# Patient Record
Sex: Female | Born: 1975 | Race: Black or African American | Hispanic: No | Marital: Married | Smoking: Current every day smoker
Health system: Southern US, Community
[De-identification: ages and names within clinical notes are randomized; demographics above are authoritative.]

---

## 1999-07-11 ENCOUNTER — Emergency Department (HOSPITAL_COMMUNITY): Admission: EM | Admit: 1999-07-11 | Discharge: 1999-07-11 | Payer: Self-pay | Admitting: Emergency Medicine

## 2001-12-01 ENCOUNTER — Emergency Department (HOSPITAL_COMMUNITY): Admission: EM | Admit: 2001-12-01 | Discharge: 2001-12-01 | Payer: Self-pay | Admitting: Emergency Medicine

## 2001-12-05 ENCOUNTER — Encounter: Payer: Self-pay | Admitting: Emergency Medicine

## 2001-12-05 ENCOUNTER — Emergency Department (HOSPITAL_COMMUNITY): Admission: EM | Admit: 2001-12-05 | Discharge: 2001-12-05 | Payer: Self-pay | Admitting: *Deleted

## 2002-04-04 ENCOUNTER — Emergency Department (HOSPITAL_COMMUNITY): Admission: EM | Admit: 2002-04-04 | Discharge: 2002-04-04 | Payer: Self-pay | Admitting: *Deleted

## 2002-09-07 ENCOUNTER — Emergency Department (HOSPITAL_COMMUNITY): Admission: EM | Admit: 2002-09-07 | Discharge: 2002-09-07 | Payer: Self-pay | Admitting: Emergency Medicine

## 2010-07-12 ENCOUNTER — Emergency Department: Payer: Self-pay | Admitting: Emergency Medicine

## 2014-11-07 ENCOUNTER — Emergency Department: Payer: Self-pay | Admitting: Student

## 2015-05-03 ENCOUNTER — Emergency Department: Payer: BLUE CROSS/BLUE SHIELD

## 2015-05-03 ENCOUNTER — Encounter: Payer: Self-pay | Admitting: *Deleted

## 2015-05-03 ENCOUNTER — Emergency Department
Admission: EM | Admit: 2015-05-03 | Discharge: 2015-05-04 | Disposition: A | Discharge status: Home / Self Care | Payer: BLUE CROSS/BLUE SHIELD | Attending: Emergency Medicine | Admitting: Emergency Medicine

## 2015-05-03 DIAGNOSIS — Z720 Tobacco use: Secondary | ICD-10-CM | POA: Diagnosis not present

## 2015-05-03 DIAGNOSIS — R51 Headache: Secondary | ICD-10-CM | POA: Insufficient documentation

## 2015-05-03 DIAGNOSIS — R519 Headache, unspecified: Secondary | ICD-10-CM

## 2015-05-03 LAB — CBC
HCT: 33.8 % — ABNORMAL LOW (ref 35.0–47.0)
HEMOGLOBIN: 11.1 g/dL — AB (ref 12.0–16.0)
MCH: 27.7 pg (ref 26.0–34.0)
MCHC: 32.8 g/dL (ref 32.0–36.0)
MCV: 84.7 fL (ref 80.0–100.0)
Platelets: 211 10*3/uL (ref 150–440)
RBC: 4 MIL/uL (ref 3.80–5.20)
RDW: 15.5 % — ABNORMAL HIGH (ref 11.5–14.5)
WBC: 6.6 10*3/uL (ref 3.6–11.0)

## 2015-05-03 LAB — BASIC METABOLIC PANEL
Anion gap: 6 (ref 5–15)
BUN: 7 mg/dL (ref 6–20)
CO2: 27 mmol/L (ref 22–32)
CREATININE: 0.94 mg/dL (ref 0.44–1.00)
Calcium: 8.5 mg/dL — ABNORMAL LOW (ref 8.9–10.3)
Chloride: 106 mmol/L (ref 101–111)
GFR calc non Af Amer: >60 mL/min (ref >60)
Glucose, Bld: 86 mg/dL (ref 65–99)
Potassium: 3.9 mmol/L (ref 3.5–5.1)
Sodium: 139 mmol/L (ref 135–145)

## 2015-05-03 LAB — HCG, QUANTITATIVE, PREGNANCY: hCG, Beta Chain, Quant, S: <1 m[IU]/mL (ref <5)

## 2015-05-03 MED ORDER — SODIUM CHLORIDE 0.9 % IV BOLUS (SEPSIS): 1000.0000 mL | Freq: Once | INTRAVENOUS | Status: DC

## 2015-05-03 MED ORDER — METOCLOPRAMIDE HCL 5 MG/ML IJ SOLN
10.0000 mg | Freq: Once | INTRAMUSCULAR | Status: AC
  Administered 2015-05-03: 10 mg via INTRAVENOUS

## 2015-05-03 MED ORDER — MORPHINE SULFATE 4 MG/ML IJ SOLN
INTRAMUSCULAR | Status: AC
  Administered 2015-05-03: 4 mg via INTRAVENOUS
  Filled 2015-05-03: qty 1

## 2015-05-03 MED ORDER — SODIUM CHLORIDE 0.9 % IV BOLUS (SEPSIS)
1000.0000 mL | Freq: Once | INTRAVENOUS | Status: AC
  Administered 2015-05-03: 1000 mL via INTRAVENOUS

## 2015-05-03 MED ORDER — MORPHINE SULFATE 4 MG/ML IJ SOLN
4.0000 mg | Freq: Once | INTRAMUSCULAR | Status: AC
  Administered 2015-05-03: 4 mg via INTRAVENOUS

## 2015-05-03 MED ORDER — METOCLOPRAMIDE HCL 5 MG/ML IJ SOLN
INTRAMUSCULAR | Status: AC
  Administered 2015-05-03: 10 mg via INTRAVENOUS
  Filled 2015-05-03: qty 2

## 2015-05-03 MED ORDER — IOHEXOL 350 MG/ML SOLN
80.0000 mL | Freq: Once | INTRAVENOUS | Status: AC | PRN
  Administered 2015-05-03: 80 mL via INTRAVENOUS

## 2015-05-03 NOTE — ED Provider Notes (Signed)
[facility] Emergency Department Provider Note  ____________________________________________  Time seen: Approximately 8:41 PM  I have reviewed the triage vital signs and the nursing notes.   HISTORY  Chief Complaint Headache    HPI Nancy Hull is a 39 y.o. female with minimal past medical history. She presents today because of a headache. Last evening she noted a slow in onset and progressively worsening headache that is throbbing over the left side of her face. Mostly over the forehead. No eye pain. It was not sudden in onset. It is not the worst headache she had her life. There is no associated numbness or tingling or trouble speaking. No problems walking. She did try to take, profound home, but states that this only helped minimally. She went to work today and the headache to seem to continue, and is not improving.  She does notice that bright lights make her headache worse. She has no fever and no pain in her neck. She does report that her sister has a history of an aneurysm, but never needed surgery and this is been monitored.  Patient reports she has had previous headaches that have been similar, the last one was a few years ago but today's does seem slightly worse.     History reviewed. No pertinent past medical history.  There are no active problems to display for this patient.   Past Surgical History  Procedure Laterality Date  . Cesarean section      No current outpatient prescriptions on file.  Allergies Review of patient's allergies indicates no known allergies.  No family history on file.  Social History History  Substance Use Topics  . Smoking status: Current Every Day Smoker  . Smokeless tobacco: Not on file  . Alcohol Use: Yes    Review of Systems Constitutional: No fever/chills Eyes: No visual changes. ENT: No sore throat. Cardiovascular: Denies chest pain. Respiratory: Denies shortness of  breath. Gastrointestinal: No abdominal pain.  No nausea, no vomiting.  No diarrhea.  No constipation. Genitourinary: Negative for dysuria. Musculoskeletal: Negative for back pain. Skin: Negative for rash. Neurological: Negative for focal weakness or numbness.  10-point ROS otherwise negative.  ____________________________________________   PHYSICAL EXAM:  VITAL SIGNS: ED Triage Vitals  Enc Vitals Group     BP 05/03/15 1736 134/87 mmHg     Pulse Rate 05/03/15 1736 66     Resp 05/03/15 1736 20     Temp 05/03/15 1736 98.4 F (36.9 C)     Temp src --      SpO2 05/03/15 1736 98 %     Weight 05/03/15 1736 157 lb (71.215 kg)     Height 05/03/15 1736 5\' 2"  (1.575 m)     Head Cir --      Peak Flow --      Pain Score 05/03/15 1737 10     Pain Loc --      Pain Edu? --      Excl. in GC? --     Constitutional: Alert and oriented. Well appearing and in no acute distress. Eyes: Conjunctivae are normal. PERRL. EOMI. there is mild photophobia. No tenderness over the temporal arteries, both with normal pulsations. Head: Atraumatic. Nose: No congestion/rhinnorhea. Mouth/Throat: Mucous membranes are moist.  Oropharynx non-erythematous. Neck: No stridor.  No meningismus. Negative jolt accentuation. Cardiovascular: Normal rate, regular rhythm. Grossly normal heart sounds.  Good peripheral circulation. Respiratory: Normal respiratory effort.  No retractions. Lungs CTAB. Gastrointestinal: Soft and nontender. No distention. No abdominal  bruits. No CVA tenderness. Musculoskeletal: No lower extremity tenderness nor edema.  No joint effusions. Neurologic:  Normal speech and language. No gross focal neurologic deficits are appreciated. Speech is normal. She is able to walk with normal gait. No pronator drift. Cranial nerve exam is normal. 5 out of 5 strength and normal sensation in all extremities. Skin:  Skin is warm, dry and intact. No rash noted. Psychiatric: Mood and affect are normal. Speech  and behavior are normal.  ____________________________________________   LABS (all labs ordered are listed, but only abnormal results are displayed)  Labs Reviewed  CBC - Abnormal; Notable for the following:    Hemoglobin 11.1 (*)    HCT 33.8 (*)    RDW 15.5 (*)    All other components within normal limits  BASIC METABOLIC PANEL - Abnormal; Notable for the following:    Calcium 8.5 (*)    All other components within normal limits  HCG, QUANTITATIVE, PREGNANCY   ____________________________________________  EKG   ____________________________________________  RADIOLOGY  CT Angio Head W/Cm &/Or Wo Cm (Final result) Result time: 05/03/15 22:49:56   Final result by Rad Results In Interface (05/03/15 22:49:56)   Narrative:   CLINICAL DATA: Initial evaluation for acute left-sided headache.  EXAM: CT ANGIOGRAPHY HEAD  TECHNIQUE: Multidetector CT imaging of the head was performed using the standard protocol during bolus administration of intravenous contrast. Multiplanar CT image reconstructions and MIPs were obtained to evaluate the vascular anatomy.  CONTRAST: 80mL OMNIPAQUE IOHEXOL 350 MG/ML SOLN  COMPARISON: Prior CT from 07/13/2010.  FINDINGS: CT HEAD  There is no acute intracranial hemorrhage or infarct. No mass lesion or midline shift. Gray-white matter differentiation is well maintained. Ventricles are normal in size without evidence of hydrocephalus. CSF containing spaces are within normal limits. No extra-axial fluid collection.  The calvarium is intact.  Orbital soft tissues are within normal limits.  The paranasal sinuses and mastoid air cells are well pneumatized and free of fluid.  Scalp soft tissues are unremarkable.  CTA HEAD  Anterior circulation: Visualized portions of the distal cervical segments of the internal carotid arteries are widely patent. The petrous, cavernous, and supra clinoid segments are well opacified. A1 segments,  anterior communicating artery, and anterior cerebral arteries well opacified.  M1 segments are widely patent without stenosis or occlusion. MCA bifurcations within normal limits. Distal MCA branches well opacified.  Posterior circulation: Vertebral arteries are widely patent to the vertebrobasilar junction. Posterior inferior cerebral arteries are patent bilaterally. Basilar artery widely patent. Superior cerebellar arteries are well opacified bilaterally. Both the posterior cerebral arteries arise from the basilar arteries. No stenosis or occlusion within the posterior circulation.  Venous sinuses: Venous sinuses are widely patent without thrombosis or occlusion.  Anatomic variants: No anatomic variant. No aneurysm.  Delayed phase:No abnormal enhancement on delayed sequence.  IMPRESSION: 1. No acute intracranial process. 2. Normal CTA of the head.      CT head with and without contrast was performed because of the patient's family history of aneurysm and a sister. ____________________________________________   PROCEDURES  Procedure(s) performed: None  Critical Care performed: No  ____________________________________________   INITIAL IMPRESSION / ASSESSMENT AND PLAN / ED COURSE  Pertinent labs & imaging results that were available during my care of the patient were reviewed by me and considered in my medical decision making (see chart for details).  Patient presents with throbbing left-sided headache which is moderate to severe in intensity and associated with some photophobia. She has no fevers or meningismus  associated. No systemic symptoms such as fever or anything to indicate the possibility of meningitis.  Based on the patient's history of a slow in onset throbbing left-sided headache I would favor that this is likely not related to an aneurysm, but given the patient's family history of cerebral aneurysm we will obtain angiography as well as noncontrast head CT.  I will treat her pain with antiemetics and pain medicine and we will reassess.  ----------------------------------------- 10:36 PM on 05/03/2015 -----------------------------------------  Patient reports that her headache is essentially gone now. She feels much better. Her exam is very reassuring and she is fully alert with normal neurologic exam. We continue weight CT angiography. Based on the symptomatology of a relatively slow onset throbbing left-sided headache, significant improvement, no evidence of any infectious etiology and negative meningismus/jolt accentuation, I do feel that should CT CTA be normal the patient could be safely discharged home to follow up with neurology and her primary care doctor.  I did discuss good return precautions with the patient, and she and her family are quite agreeable with plan. She is not driving self home. ____________________________________________   FINAL CLINICAL IMPRESSION(S) / ED DIAGNOSES  Final diagnoses:  Acute nonintractable headache, unspecified headache type      Sharyn Creamer, MD 05/03/15 2302

## 2015-05-03 NOTE — Discharge Instructions (Signed)
General Headache Without Cause  Please follow-up with neurology and your doctor area return to the emergency room right away should you have severe headache, weakness, numbness or tingling, develop a fever, stiff neck or other concerns or symptoms arise.  A general headache is pain or discomfort felt around the head or neck area. The cause may not be found.  HOME CARE   Keep all doctor visits.  Only take medicines as told by your doctor.  Lie down in a dark, quiet room when you have a headache.  Keep a journal to find out if certain things bring on headaches. For example, write down:  What you eat and drink.  How much sleep you get.  Any change to your diet or medicines.  Relax by getting a massage or doing other relaxing activities.  Put ice or heat packs on the head and neck area as told by your doctor.  Lessen stress.  Sit up straight. Do not tighten (tense) your muscles.  Quit smoking if you smoke.  Lessen how much alcohol you drink.  Lessen how much caffeine you drink, or stop drinking caffeine.  Eat and sleep on a regular schedule.  Get 7 to 9 hours of sleep, or as told by your doctor.  Keep lights dim if bright lights bother you or make your headaches worse. GET HELP RIGHT AWAY IF:   Your headache becomes really bad.  You have a fever.  You have a stiff neck.  You have trouble seeing.  Your muscles are weak, or you lose muscle control.  You lose your balance or have trouble walking.  You feel like you will pass out (faint), or you pass out.  You have really bad symptoms that are different than your first symptoms.  You have problems with the medicines given to you by your doctor.  Your medicines do not work.  Your headache feels different than the other headaches.  You feel sick to your stomach (nauseous) or throw up (vomit). MAKE SURE YOU:   Understand these instructions.  Will watch your condition.  Will get help right away if you are  not doing well or get worse. Document Released: 07/24/2008 Document Revised: 01/07/2012 Document Reviewed: 10/05/2011 [facility] Patient Information 2015 [employer], [place]. This information is not intended to replace advice given to you by your health care provider. Make sure you discuss any questions you have with your health care provider.

## 2015-05-03 NOTE — ED Notes (Signed)
Patient denies pain and is resting comfortably.  

## 2015-05-03 NOTE — ED Notes (Signed)
Ha since yesterday, light hurts her eyes

## 2016-02-12 ENCOUNTER — Emergency Department (HOSPITAL_COMMUNITY)
Admission: EM | Admit: 2016-02-12 | Discharge: 2016-02-12 | Disposition: A | Discharge status: Home / Self Care | Payer: BLUE CROSS/BLUE SHIELD | Attending: Emergency Medicine | Admitting: Emergency Medicine

## 2016-02-12 ENCOUNTER — Emergency Department (HOSPITAL_COMMUNITY): Payer: BLUE CROSS/BLUE SHIELD

## 2016-02-12 ENCOUNTER — Encounter (HOSPITAL_COMMUNITY): Payer: Self-pay | Admitting: Emergency Medicine

## 2016-02-12 DIAGNOSIS — R51 Headache: Secondary | ICD-10-CM | POA: Insufficient documentation

## 2016-02-12 DIAGNOSIS — D649 Anemia, unspecified: Secondary | ICD-10-CM | POA: Insufficient documentation

## 2016-02-12 DIAGNOSIS — R112 Nausea with vomiting, unspecified: Secondary | ICD-10-CM | POA: Insufficient documentation

## 2016-02-12 DIAGNOSIS — F17200 Nicotine dependence, unspecified, uncomplicated: Secondary | ICD-10-CM | POA: Insufficient documentation

## 2016-02-12 DIAGNOSIS — R519 Headache, unspecified: Secondary | ICD-10-CM

## 2016-02-12 LAB — CBC WITH DIFFERENTIAL/PLATELET
BASOS ABS: 0 10*3/uL (ref 0.0–0.1)
BASOS PCT: 0 %
Eosinophils Absolute: 0.1 10*3/uL (ref 0.0–0.7)
Eosinophils Relative: 1 %
HEMATOCRIT: 36 % (ref 36.0–46.0)
HEMOGLOBIN: 11.5 g/dL — AB (ref 12.0–15.0)
Lymphocytes Relative: 39 %
Lymphs Abs: 2.5 10*3/uL (ref 0.7–4.0)
MCH: 27.4 pg (ref 26.0–34.0)
MCHC: 31.9 g/dL (ref 30.0–36.0)
MCV: 85.7 fL (ref 78.0–100.0)
MONOS PCT: 6 %
Monocytes Absolute: 0.4 10*3/uL (ref 0.1–1.0)
NEUTROS ABS: 3.4 10*3/uL (ref 1.7–7.7)
Neutrophils Relative %: 54 %
Platelets: 239 10*3/uL (ref 150–400)
RBC: 4.2 MIL/uL (ref 3.87–5.11)
RDW: 14.3 % (ref 11.5–15.5)
WBC: 6.3 10*3/uL (ref 4.0–10.5)

## 2016-02-12 LAB — BASIC METABOLIC PANEL
ANION GAP: 9 (ref 5–15)
BUN: 7 mg/dL (ref 6–20)
CHLORIDE: 107 mmol/L (ref 101–111)
CO2: 23 mmol/L (ref 22–32)
Calcium: 8.8 mg/dL — ABNORMAL LOW (ref 8.9–10.3)
Creatinine, Ser: 0.82 mg/dL (ref 0.44–1.00)
GFR calc non Af Amer: >60 mL/min (ref >60)
Glucose, Bld: 87 mg/dL (ref 65–99)
Potassium: 4.1 mmol/L (ref 3.5–5.1)
Sodium: 139 mmol/L (ref 135–145)

## 2016-02-12 MED ORDER — KETOROLAC TROMETHAMINE 30 MG/ML IJ SOLN
30.0000 mg | Freq: Once | INTRAMUSCULAR | Status: AC
  Administered 2016-02-12: 30 mg via INTRAVENOUS
  Filled 2016-02-12: qty 1

## 2016-02-12 MED ORDER — METHYLPREDNISOLONE SODIUM SUCC 125 MG IJ SOLR
125.0000 mg | Freq: Once | INTRAMUSCULAR | Status: AC
  Administered 2016-02-12: 125 mg via INTRAVENOUS
  Filled 2016-02-12: qty 2

## 2016-02-12 MED ORDER — SODIUM CHLORIDE 0.9 % IV BOLUS (SEPSIS)
1000.0000 mL | Freq: Once | INTRAVENOUS | Status: AC
  Administered 2016-02-12: 1000 mL via INTRAVENOUS

## 2016-02-12 MED ORDER — PROCHLORPERAZINE EDISYLATE 5 MG/ML IJ SOLN
10.0000 mg | Freq: Once | INTRAMUSCULAR | Status: AC
  Administered 2016-02-12: 10 mg via INTRAVENOUS
  Filled 2016-02-12: qty 2

## 2016-02-12 NOTE — ED Notes (Signed)
Pt. reports headache with photophobia , emesis and right ear ache onset this week . Denies injury , no fever or chills.

## 2016-02-12 NOTE — ED Provider Notes (Signed)
CSN: [id number]     Arrival date & time 02/12/16  0540 History   First MD Initiated Contact with Patient 02/12/16 [phone]     Chief Complaint  Patient presents with  . Headache  . Otalgia     (Consider location/radiation/quality/duration/timing/severity/associated sxs/prior Treatment) HPI 40 y.o. Female complaining of headache and ear pain began 3 days ago.  She first noted pain right cheek and thought it was her teeth. Pain then migrated to in front of right ear and now generalized headache.  2 days ago began having nausea and vomiting.  She denies fever, vision changes, cough, sore throat, nasal congestion, abdominal pain or diarrhea.  She does not usually get headaches.  Onset was gradual, now 9/10 pain.  Taking ibuprofen without relief.  She is currently menstruating.  She has one previous visit for severe headache.  She denies history of migraines.   History reviewed. No pertinent past medical history. Past Surgical History  Procedure Laterality Date  . Cesarean section     No family history on file. Social History  Substance Use Topics  . Smoking status: Current Every Day Smoker  . Smokeless tobacco: None  . Alcohol Use: Yes   OB History    No data available     Review of Systems  All other systems reviewed and are negative.     Allergies  Review of patient's allergies indicates no known allergies.  Home Medications   Prior to Admission medications   Not on File   BP 130/70 mmHg  Pulse 68  Temp(Src) 98 F (36.7 C) (Oral)  Resp 18  Ht 5\' 3"  (1.6 m)  Wt 81.647 kg  BMI 31.89 kg/m2  SpO2 100%  LMP 02/12/2016 Physical Exam  Constitutional: She is oriented to person, place, and time. She appears well-developed and well-nourished.  HENT:  Head: Normocephalic and atraumatic.  Right Ear: External ear normal.  Left Ear: External ear normal.  Nose: Nose normal.  Mouth/Throat: Oropharynx is clear and moist.  Eyes: Conjunctivae and EOM are normal. Pupils are equal,  round, and reactive to light.  Neck: Normal range of motion. Neck supple.  Cardiovascular: Normal rate, regular rhythm, normal heart sounds and intact distal pulses.   Pulmonary/Chest: Effort normal and breath sounds normal.  Abdominal: Soft. Bowel sounds are normal.  Musculoskeletal: Normal range of motion.  Neurological: She is alert and oriented to person, place, and time. She has normal reflexes.  Skin: Skin is warm and dry.  Psychiatric: She has a normal mood and affect. Her behavior is normal. Judgment and thought content normal.  Nursing note and vitals reviewed.   ED Course  Procedures (including critical care time) Labs Review Labs Reviewed  CBC WITH DIFFERENTIAL/PLATELET  BASIC METABOLIC PANEL    Imaging Review Ct Head Wo Contrast  02/12/2016  CLINICAL DATA:  40 year old female with headache for the past 2 days and photosensitivity. EXAM: CT HEAD WITHOUT CONTRAST TECHNIQUE: Contiguous axial images were obtained from the base of the skull through the vertex without intravenous contrast. COMPARISON:  Head CT 05/03/2015. FINDINGS: No acute intracranial abnormalities. Specifically, no evidence of acute intracranial hemorrhage, no definite findings of acute/subacute cerebral ischemia, no mass, mass effect, hydrocephalus or abnormal intra or extra-axial fluid collections. Visualized paranasal sinuses and mastoids are well pneumatized. No acute displaced skull fractures are identified. IMPRESSION: *No acute intracranial abnormalities. *The appearance of the brain is normal. Electronically Signed   By: Trudie Reedaniel  Entrikin M.D.   On: 02/12/2016 08:09   I have  personally reviewed and evaluated these images and lab results as part of my medical decision-making.   EKG Interpretation None      MDM   Final diagnoses:  Acute nonintractable headache, unspecified headache type  Anemia, unspecified anemia type    40 year old female came in with right-sided headache. She has had some facial  pain and pain anterior to her ear with this. Subsequent to this a head CT was obtained. There is no evidence of dental abscess, sinus infection, mastoiditis, or intracranial pathology. She is better here after Compazine, Solu-Medrol, and Toradol. She is advised regarding return precautions and need for close follow-up. Her hemoglobin was slightly low at 11 on her lab work. She states that she does have heavy periods. I have advised her to start iron and to obtain follow-up and she voices understanding.    Margarita Grizzle, MD 02/14/16 [phone]

## 2016-02-12 NOTE — Discharge Instructions (Signed)
Anemia, Nonspecific Anemia is a condition in which the concentration of red blood cells or hemoglobin in the blood is below normal. Hemoglobin is a substance in red blood cells that carries oxygen to the tissues of the body. Anemia results in not enough oxygen reaching these tissues.  CAUSES  Common causes of anemia include:   Excessive bleeding. Bleeding may be internal or external. This includes excessive bleeding from periods (in women) or from the intestine.   Poor nutrition.   Chronic kidney, thyroid, and liver disease.  Bone marrow disorders that decrease red blood cell production.  Cancer and treatments for cancer.  HIV, AIDS, and their treatments.  Spleen problems that increase red blood cell destruction.  Blood disorders.  Excess destruction of red blood cells due to infection, medicines, and autoimmune disorders. SIGNS AND SYMPTOMS   Minor weakness.   Dizziness.   Headache.  Palpitations.   Shortness of breath, especially with exercise.   Paleness.  Cold sensitivity.  Indigestion.  Nausea.  Difficulty sleeping.  Difficulty concentrating. Symptoms may occur suddenly or they may develop slowly.  DIAGNOSIS  Additional blood tests are often needed. These help your health care provider determine the best treatment. Your health care provider will check your stool for blood and look for other causes of blood loss.  TREATMENT  Treatment varies depending on the cause of the anemia. Treatment can include:   Supplements of iron, vitamin B12, or folic acid.   Hormone medicines.   A blood transfusion. This may be needed if blood loss is severe.   Hospitalization. This may be needed if there is significant continual blood loss.   Dietary changes.  Spleen removal. HOME CARE INSTRUCTIONS Keep all follow-up appointments. It often takes many weeks to correct anemia, and having your health care provider check on your condition and your response to  treatment is very important. SEEK IMMEDIATE MEDICAL CARE IF:   You develop extreme weakness, shortness of breath, or chest pain.   You become dizzy or have trouble concentrating.  You develop heavy vaginal bleeding.   You develop a rash.   You have bloody or black, tarry stools.   You faint.   You vomit up blood.   You vomit repeatedly.   You have abdominal pain.  You have a fever or persistent symptoms for more than 2-3 days.   You have a fever and your symptoms suddenly get worse.   You are dehydrated.  MAKE SURE YOU:  Understand these instructions.  Will watch your condition.  Will get help right away if you are not doing well or get worse.   This information is not intended to replace advice given to you by your health care provider. Make sure you discuss any questions you have with your health care provider.   Document Released: 11/22/2004 Document Revised: 06/17/2013 Document Reviewed: 04/10/2013 Elsevier Interactive Patient Education 2016 [employer]. Migraine Headache A migraine headache is very bad, throbbing pain on one or both sides of your head. Talk to your doctor about what things may bring on (trigger) your migraine headaches. HOME CARE  Only take medicines as told by your doctor.  Lie down in a dark, quiet room when you have a migraine.  Keep a journal to find out if certain things bring on migraine headaches. For example, write down:  What you eat and drink.  How much sleep you get.  Any change to your diet or medicines.  Lessen how much alcohol you drink.  Quit smoking  if you smoke.  Get enough sleep.  Lessen any stress in your life.  Keep lights dim if bright lights bother you or make your migraines worse. GET HELP RIGHT AWAY IF:   Your migraine becomes really bad.  You have a fever.  You have a stiff neck.  You have trouble seeing.  Your muscles are weak, or you lose muscle control.  You lose your balance or  have trouble walking.  You feel like you will pass out (faint), or you pass out.  You have really bad symptoms that are different than your first symptoms. MAKE SURE YOU:   Understand these instructions.  Will watch your condition.  Will get help right away if you are not doing well or get worse.   This information is not intended to replace advice given to you by your health care provider. Make sure you discuss any questions you have with your health care provider.   Document Released: 07/24/2008 Document Revised: 01/07/2012 Document Reviewed: 06/22/2013 Elsevier Interactive Patient Education [employer]

## 2016-02-12 NOTE — ED Notes (Signed)
Patient transported to CT 

## 2016-07-05 ENCOUNTER — Emergency Department (HOSPITAL_COMMUNITY)
Admission: EM | Admit: 2016-07-05 | Discharge: 2016-07-05 | Disposition: A | Discharge status: Home / Self Care | Payer: BLUE CROSS/BLUE SHIELD | Attending: Emergency Medicine | Admitting: Emergency Medicine

## 2016-07-05 ENCOUNTER — Encounter (HOSPITAL_COMMUNITY): Payer: Self-pay | Admitting: Emergency Medicine

## 2016-07-05 DIAGNOSIS — F17200 Nicotine dependence, unspecified, uncomplicated: Secondary | ICD-10-CM | POA: Diagnosis not present

## 2016-07-05 DIAGNOSIS — H9201 Otalgia, right ear: Secondary | ICD-10-CM | POA: Insufficient documentation

## 2016-07-05 DIAGNOSIS — K0889 Other specified disorders of teeth and supporting structures: Secondary | ICD-10-CM | POA: Diagnosis present

## 2016-07-05 MED ORDER — NAPROXEN 500 MG PO TABS: 500.0000 mg | ORAL_TABLET | Freq: Two times a day (BID) | ORAL | 0 refills | Status: DC

## 2016-07-05 MED ORDER — PENICILLIN V POTASSIUM 500 MG PO TABS: 500.0000 mg | ORAL_TABLET | Freq: Four times a day (QID) | ORAL | 0 refills | Status: AC

## 2016-07-05 MED ORDER — LIDOCAINE VISCOUS 2 % MT SOLN: 15.0000 mL | OROMUCOSAL | 0 refills | Status: AC | PRN

## 2016-07-05 NOTE — ED Provider Notes (Signed)
[facility]EM[facility] DEPT Provider Note   CSN: [id number] Arrival date & time: 07/05/16  0920  By signing my name below, I, Placido SouLogan Joldersma, attest that this documentation has been prepared under the direction and in the presence of Laasya Peyton C. Quinlan Vollmer, PA-C. Electronically Signed: Placido SouLogan Joldersma, ED Scribe. 07/05/16. 10:02 AM.   History   Chief Complaint Chief Complaint  Patient presents with  . Dental Pain  . Otalgia    HPI HPI Comments: Nancy Hull is a 40 y.o. female with a h/o smoking who presents to the Emergency Department complaining of worsening, moderate, right upper dental pain x 2 days. Pt reports having broken teeth in the affected region and has associated right ear pain. Her pain worsens with jaw movement, palpation and chewing. She is unsure of any right ear drainage but does note applying ear drops to her right ear w/o significant relief. Pt just relocated to the area and does not have a local dental provider. Pt denies other associated symptoms at this time including fever/chills, N/V, or difficulty swallowing or breathing.   The history is provided by the patient. No language interpreter was used.    History reviewed. No pertinent past medical history.  There are no active problems to display for this patient.   Past Surgical History:  Procedure Laterality Date  . CESAREAN SECTION      OB History    No data available      Home Medications    Prior to Admission medications   Medication Sig Start Date End Date Taking? Authorizing Provider  lidocaine (XYLOCAINE) 2 % solution Use as directed 15 mLs in the mouth or throat as needed for mouth pain. 07/05/16   Stacee Earp C Aulden Calise, PA-C  naproxen (NAPROSYN) 500 MG tablet Take 1 tablet (500 mg total) by mouth 2 (two) times daily. 07/05/16   Aayansh Codispoti C Carlena Ruybal, PA-C  penicillin v potassium (VEETID) 500 MG tablet Take 1 tablet (500 mg total) by mouth 4 (four) times daily. 07/05/16 07/12/16  Anselm PancoastShawn C Monserat Prestigiacomo, PA-C    Family History No family  history on file.  Social History Social History  Substance Use Topics  . Smoking status: Current Every Day Smoker  . Smokeless tobacco: Not on file  . Alcohol use Yes    Allergies   Review of patient's allergies indicates no known allergies.   Review of Systems Review of Systems  Constitutional: Negative for chills and fever.  HENT: Positive for dental problem and ear pain. Negative for ear discharge and sore throat.    Physical Exam Updated Vital Signs BP 134/72 (BP Location: Left Arm)   Pulse 62   Temp 99 F (37.2 C) (Oral)   Resp 20   Ht 5\' 2"  (1.575 m)   Wt 178 lb (80.7 kg)   LMP 06/13/2016 (Exact Date)   SpO2 100%   BMI 32.56 kg/m   Physical Exam  Constitutional: She appears well-developed and well-nourished. No distress.  HENT:  Head: Normocephalic and atraumatic.  Right Ear: Hearing, tympanic membrane, external ear and ear canal normal.  Left Ear: Hearing, tympanic membrane, external ear and ear canal normal.  Tenderness to the right upper buccal surface and to the right upper rear gingival surface. No area of fluctuance to suggest an abscess. No facial swelling. Readily handles oral secretions w/o difficulty. Mouth opening to at least three finger widths.   Eyes: Conjunctivae are normal.  Neck: Normal range of motion. Neck supple.  Cardiovascular: Normal rate and regular rhythm.  Pulmonary/Chest: Effort normal.  Lymphadenopathy:    She has no cervical adenopathy.  Neurological: She is alert.  Skin: Skin is warm and dry. She is not diaphoretic.  Psychiatric: She has a normal mood and affect. Her behavior is normal.  Nursing note and vitals reviewed.  ED Treatments / Results  Labs (all labs ordered are listed, but only abnormal results are displayed) Labs Reviewed - No data to display  EKG  EKG Interpretation None       Radiology No results found.  Procedures .Nerve Block Date/Time: 07/05/2016 10:04 AM Performed by: Anselm Pancoast Authorized  by: Anselm Pancoast   Consent:    Consent obtained:  Verbal   Consent given by:  Patient   Risks discussed:  Allergic reaction, bleeding, infection, nerve damage, swelling, unsuccessful block and pain   Alternatives discussed:  No treatment and delayed treatment Indications:    Indications:  Pain relief Location:    Body area:  Head   Head nerve blocked: Superior posterior alveolar.   Laterality:  Right Skin anesthesia (see MAR for exact dosages):    Skin anesthesia method:  Topical application   Topical anesthetic:  Lidocaine gel Procedure details (see MAR for exact dosages):    Block needle gauge:  27 G   Anesthetic injected:  Bupivacaine 0.5% WITH epi Post-procedure details:    Outcome:  Pain relieved   Patient tolerance of procedure:  Tolerated well, no immediate complications     DIAGNOSTIC STUDIES: Oxygen Saturation is 100% on RA, normal by my interpretation.    COORDINATION OF CARE: 10:01 AM Discussed next steps with pt. Pt verbalized understanding and is agreeable with the plan.    Medications Ordered in ED Medications - No data to display   Initial Impression / Assessment and Plan / ED Course  I have reviewed the triage vital signs and the nursing notes.  Pertinent labs & imaging results that were available during my care of the patient were reviewed by me and considered in my medical decision making (see chart for details).  Clinical Course    Patient with dentalgia.  No abscess requiring immediate incision and drainage.  Exam not concerning for Ludwig's angina or pharyngeal abscess.  Pt given a dental block for short term pain relief and instructed to follow-up with dentist.  Discussed return precautions. Pt safe for discharge.  I personally performed the services described in this documentation, which was scribed in my presence. The recorded information has been reviewed and is accurate.   Final Clinical Impressions(s) / ED Diagnoses   Final diagnoses:    Dentalgia    New Prescriptions Discharge Medication List as of 07/05/2016 10:18 AM    START taking these medications   Details  lidocaine (XYLOCAINE) 2 % solution Use as directed 15 mLs in the mouth or throat as needed for mouth pain., Starting Thu 07/05/2016, Print    naproxen (NAPROSYN) 500 MG tablet Take 1 tablet (500 mg total) by mouth 2 (two) times daily., Starting Thu 07/05/2016, Print    penicillin v potassium (VEETID) 500 MG tablet Take 1 tablet (500 mg total) by mouth 4 (four) times daily., Starting Thu 07/05/2016, Until Thu 07/12/2016, Print         Anselm Pancoast, PA-C 07/05/16 1043    Vanetta Mulders, MD 07/07/16 [phone]

## 2016-07-05 NOTE — ED Triage Notes (Signed)
Started 2 days ago with right ear vs right dental pain.

## 2016-07-05 NOTE — Discharge Instructions (Signed)
You have been seen today for dental pain. Follow up with a dentist as soon as possible. Please take all of your antibiotics until finished!   You may develop abdominal discomfort or diarrhea from the antibiotic.  You may help offset this with probiotics which you can buy or get in yogurt. Do not eat or take the probiotics until 2 hours after your antibiotic.   Take 500 mg of naproxen every 12 hours or 800 mg of ibuprofen every 8 hours for the next 3 days. Take these medications with food to avoid upset stomach.   Use the viscous lidocaine as needed for mouth pain. Swish with the lidocaine and then spit it out. Do not swallow it.   Dental Resource Guide  [facility] [address], [address] [place], [place] [address] [phone]  [facility] [place] [address] [place], [place] [address] [phone]  [facility] [address] [place], [place] [address] [phone]. [phone]  [facility] [address] [place], [place] [address] [phone]  [facility] [address] [place], [place] [address] [phone]  [facility] [email or web address]/patientcare/[email or web address]  [employer] [address] [facility] [place], [place] [address] [phone]  Website for free, low-income, or sliding scale dental services in [place]: www.freedental.us  To find a dentist in [place] and surrounding areas: [employer].[email or web address]/for-the-public/find-[email or web address]-dentist  [facility] Test[employer].athttp://www.ncdental.org/meetings-events/[place]-missions-of-mercy  [facility] Dentist http://[email or web address]/https://dma.[email or web address]/find-[email or web address]-doctor/medicaid-dental-providers

## 2016-07-20 ENCOUNTER — Ambulatory Visit (HOSPITAL_COMMUNITY)
Admission: EM | Admit: 2016-07-20 | Discharge: 2016-07-20 | Disposition: A | Discharge status: Home / Self Care | Payer: BLUE CROSS/BLUE SHIELD | Attending: Internal Medicine | Admitting: Internal Medicine

## 2016-07-20 ENCOUNTER — Encounter (HOSPITAL_COMMUNITY): Payer: Self-pay | Admitting: Emergency Medicine

## 2016-07-20 DIAGNOSIS — K0889 Other specified disorders of teeth and supporting structures: Secondary | ICD-10-CM

## 2016-07-20 DIAGNOSIS — K047 Periapical abscess without sinus: Secondary | ICD-10-CM | POA: Diagnosis not present

## 2016-07-20 MED ORDER — ONDANSETRON HCL 4 MG PO TABS: 4.0000 mg | ORAL_TABLET | Freq: Four times a day (QID) | ORAL | 0 refills | Status: DC

## 2016-07-20 MED ORDER — OXYCODONE-ACETAMINOPHEN 10-325 MG PO TABS: 1.0000 | ORAL_TABLET | ORAL | 0 refills | Status: DC | PRN

## 2016-07-20 MED ORDER — AMOXICILLIN 500 MG PO CAPS: 500.0000 mg | ORAL_CAPSULE | Freq: Three times a day (TID) | ORAL | 0 refills | Status: DC

## 2016-07-20 NOTE — ED Provider Notes (Signed)
CSN: [id number]     Arrival date & time 07/20/16  1913 History   None    Chief Complaint  Patient presents with  . Dental Pain   (Consider location/radiation/quality/duration/timing/severity/associated sxs/prior Treatment) HPI  Patient is a 40 year old female with right ear and jaw pain that has been present since Monday with associated nausea and vomiting. She states that she has used multiple symptomatic relief medications at home and has been unsuccessful she states that she does have a dental appointment for 29th. Patient states her pain score is about 6 at this time. History reviewed. No pertinent past medical history. Past Surgical History:  Procedure Laterality Date  . CESAREAN SECTION     History reviewed. No pertinent family history. Social History  Substance Use Topics  . Smoking status: Current Every Day Smoker  . Smokeless tobacco: Not on file  . Alcohol use Yes   OB History    No data available     Review of Systems  Denies: HEADACHE,  ABDOMINAL PAIN, CHEST PAIN, CONGESTION, DYSURIA, SHORTNESS OF BREATH  Allergies  Review of patient's allergies indicates no known allergies.  Home Medications   Prior to Admission medications   Medication Sig Start Date End Date Taking? Authorizing Provider  amoxicillin (AMOXIL) 500 MG capsule Take 1 capsule (500 mg total) by mouth 3 (three) times daily. 07/20/16   Tharon Aquas, PA  lidocaine (XYLOCAINE) 2 % solution Use as directed 15 mLs in the mouth or throat as needed for mouth pain. 07/05/16   Shawn C Joy, PA-C  naproxen (NAPROSYN) 500 MG tablet Take 1 tablet (500 mg total) by mouth 2 (two) times daily. 07/05/16   Shawn C Joy, PA-C  ondansetron (ZOFRAN) 4 MG tablet Take 1 tablet (4 mg total) by mouth every 6 (six) hours. 07/20/16   Tharon Aquas, PA  oxyCODONE-acetaminophen (PERCOCET) 10-325 MG tablet Take 1 tablet by mouth every 4 (four) hours as needed for pain. 07/20/16   Tharon Aquas, PA   Meds Ordered and Administered  this Visit  Medications - No data to display  BP 115/66 (BP Location: Left Arm)   Pulse 63   Temp 98.6 F (37 C) (Oral)   Resp 12   LMP 07/12/2016 (Exact Date)   SpO2 100%  No data found.   Physical Exam NURSES NOTES AND VITAL SIGNS REVIEWED. CONSTITUTIONAL: Well developed, well nourished, no acute distress HEENT: normocephalic, atraumatic, right lower and upper molars are in poor repair. There is a small palpable infection on the gum line of the upper teeth. The right TM is clear. EYES: Conjunctiva normal NECK:normal ROM, supple, no adenopathy PULMONARY:No respiratory distress, normal effort ABDOMINAL: Soft, ND, NT BS+, No CVAT MUSCULOSKELETAL: Normal ROM of all extremities,  SKIN: warm and dry without rash PSYCHIATRIC: Mood and affect, behavior are normal  Urgent Care Course   Clinical Course    Procedures (including critical care time)  Labs Review Labs Reviewed - No data to display  Imaging Review No results found.   Visual Acuity Review  Right Eye Distance:   Left Eye Distance:   Bilateral Distance:    Right Eye Near:   Left Eye Near:    Bilateral Near:         MDM   1. Pain, dental   2. Dental abscess     Patient is reassured that there are no issues that require transfer to higher level of care at this time or additional tests. Patient is advised to continue  home symptomatic treatment. Patient is advised that if there are new or worsening symptoms to attend the emergency department, contact primary care provider, or return to UC. Instructions of care provided discharged home in stable condition.    THIS NOTE WAS GENERATED USING A VOICE RECOGNITION SOFTWARE PROGRAM. ALL REASONABLE EFFORTS  WERE MADE TO PROOFREAD THIS DOCUMENT FOR ACCURACY.  I have verbally reviewed the discharge instructions with the patient. A printed AVS was given to the patient.  All questions were answered prior to discharge.      Tharon AquasFrank C Aarthi Uyeno, PA 07/20/16 2017

## 2016-07-20 NOTE — ED Triage Notes (Signed)
[facility] with a complaint of right ear and dental pain that she stated started 5 days ago. The [facility]/2016 for the same complaint.

## 2017-01-12 ENCOUNTER — Emergency Department (HOSPITAL_COMMUNITY): Payer: BLUE CROSS/BLUE SHIELD

## 2017-01-12 ENCOUNTER — Encounter (HOSPITAL_COMMUNITY): Payer: Self-pay | Admitting: Emergency Medicine

## 2017-01-12 ENCOUNTER — Emergency Department (HOSPITAL_COMMUNITY)
Admission: EM | Admit: 2017-01-12 | Discharge: 2017-01-12 | Disposition: A | Discharge status: Home / Self Care | Payer: BLUE CROSS/BLUE SHIELD | Attending: Emergency Medicine | Admitting: Emergency Medicine

## 2017-01-12 DIAGNOSIS — R0789 Other chest pain: Secondary | ICD-10-CM | POA: Diagnosis not present

## 2017-01-12 DIAGNOSIS — R079 Chest pain, unspecified: Secondary | ICD-10-CM | POA: Diagnosis present

## 2017-01-12 DIAGNOSIS — Z79899 Other long term (current) drug therapy: Secondary | ICD-10-CM | POA: Diagnosis not present

## 2017-01-12 DIAGNOSIS — F17210 Nicotine dependence, cigarettes, uncomplicated: Secondary | ICD-10-CM | POA: Diagnosis not present

## 2017-01-12 LAB — CBC
HEMATOCRIT: 33.2 % — AB (ref 36.0–46.0)
HEMOGLOBIN: 10.2 g/dL — AB (ref 12.0–15.0)
MCH: 25.4 pg — AB (ref 26.0–34.0)
MCHC: 30.7 g/dL (ref 30.0–36.0)
MCV: 82.6 fL (ref 78.0–100.0)
Platelets: 239 10*3/uL (ref 150–400)
RBC: 4.02 MIL/uL (ref 3.87–5.11)
RDW: 15.7 % — ABNORMAL HIGH (ref 11.5–15.5)
WBC: 9.6 10*3/uL (ref 4.0–10.5)

## 2017-01-12 LAB — BASIC METABOLIC PANEL
Anion gap: 8 (ref 5–15)
BUN: 11 mg/dL (ref 6–20)
CHLORIDE: 105 mmol/L (ref 101–111)
CO2: 25 mmol/L (ref 22–32)
Calcium: 9.1 mg/dL (ref 8.9–10.3)
Creatinine, Ser: 0.85 mg/dL (ref 0.44–1.00)
GFR calc Af Amer: >60 mL/min (ref >60)
GFR calc non Af Amer: >60 mL/min (ref >60)
GLUCOSE: 90 mg/dL (ref 65–99)
Potassium: 4.3 mmol/L (ref 3.5–5.1)
Sodium: 138 mmol/L (ref 135–145)

## 2017-01-12 LAB — I-STAT TROPONIN, ED: Troponin i, poc: 0 ng/mL (ref 0.00–0.08)

## 2017-01-12 MED ORDER — KETOROLAC TROMETHAMINE 15 MG/ML IJ SOLN
30.0000 mg | Freq: Once | INTRAMUSCULAR | Status: AC
  Administered 2017-01-12: 30 mg via INTRAMUSCULAR
  Filled 2017-01-12: qty 2

## 2017-01-12 MED ORDER — ACETAMINOPHEN 500 MG PO TABS: 1000.0000 mg | ORAL_TABLET | Freq: Three times a day (TID) | ORAL | 0 refills | Status: AC

## 2017-01-12 MED ORDER — IBUPROFEN 600 MG PO TABS: 600.0000 mg | ORAL_TABLET | Freq: Four times a day (QID) | ORAL | 0 refills | Status: DC | PRN

## 2017-01-12 NOTE — ED Triage Notes (Signed)
Pt states yesterday at work she started having right sided chest pain worse with a deep breath or moving her right arm. Pt states today pain was worse. 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 Pt also reports nausea.

## 2017-01-12 NOTE — ED Provider Notes (Signed)
[facility] Provider Note   CSN: [id number] Arrival date & time: 01/12/17  1648     History   Chief Complaint Chief Complaint  Patient presents with  . Chest Pain    HPI Nancy Hull is a 41 y.o. female.  The history is provided by the patient.  Chest Pain   This is a new problem. The current episode started 2 days ago. The problem occurs constantly. The problem has been gradually worsening. The pain is associated with movement. Pain location: right breast pain. The pain is moderate. The quality of the pain is described as stabbing. The pain does not radiate. The symptoms are aggravated by certain positions. Associated symptoms include cough, nausea and vomiting (once, 2 days ago). Pertinent negatives include no exertional chest pressure, no fever, no lower extremity edema and no shortness of breath. Risk factors include smoking/tobacco exposure and obesity.   h/o breast abscess. Has "boils" on left breast, but not on right.   History reviewed. No pertinent past medical history.  There are no active problems to display for this patient.   Past Surgical History:  Procedure Laterality Date  . CESAREAN SECTION      OB History    No data available       Home Medications    Prior to Admission medications   Medication Sig Start Date End Date Taking? Authorizing Provider  acetaminophen (TYLENOL) 500 MG tablet Take 2 tablets (1,000 mg total) by mouth every 8 (eight) hours. Do not take more than 4000 mg of acetaminophen (Tylenol) in a 24-hour period. Please note that other medicines that you may be prescribed may have Tylenol as well. 01/12/17 01/17/17  Nira Conn, MD  amoxicillin (AMOXIL) 500 MG capsule Take 1 capsule (500 mg total) by mouth 3 (three) times daily. 07/20/16   Tharon Aquas, PA  ibuprofen (ADVIL,MOTRIN) 600 MG tablet Take 1 tablet (600 mg total) by mouth every 6 (six) hours as needed. 01/12/17   Nira Conn, MD  lidocaine  (XYLOCAINE) 2 % solution Use as directed 15 mLs in the mouth or throat as needed for mouth pain. 07/05/16   Shawn C Joy, PA-C  naproxen (NAPROSYN) 500 MG tablet Take 1 tablet (500 mg total) by mouth 2 (two) times daily. 07/05/16   Shawn C Joy, PA-C  ondansetron (ZOFRAN) 4 MG tablet Take 1 tablet (4 mg total) by mouth every 6 (six) hours. 07/20/16   Tharon Aquas, PA  oxyCODONE-acetaminophen (PERCOCET) 10-325 MG tablet Take 1 tablet by mouth every 4 (four) hours as needed for pain. 07/20/16   Tharon Aquas, PA    Family History No family history on file.  Social History Social History  Substance Use Topics  . Smoking status: Current Every Day Smoker    Packs/day: 0.50    Types: Cigarettes  . Smokeless tobacco: Never Used  . Alcohol use Yes     Allergies   Patient has no known allergies.   Review of Systems Review of Systems  Constitutional: Negative for fever.  Respiratory: Positive for cough. Negative for shortness of breath.   Cardiovascular: Positive for chest pain.  Gastrointestinal: Positive for nausea and vomiting (once, 2 days ago).     Physical Exam Updated Vital Signs BP 129/73   Pulse 89   Temp 98.1 F (36.7 C) (Oral)   Resp 16   Ht 5\' 2"  (1.575 m)   Wt 171 lb (77.6 kg)   SpO2 100%   BMI 31.28  kg/m   Physical Exam  Constitutional: She is oriented to person, place, and time. She appears well-developed and well-nourished. No distress.  HENT:  Head: Normocephalic and atraumatic.  Nose: Nose normal.  Eyes: Conjunctivae and EOM are normal. Pupils are equal, round, and reactive to light. Right eye exhibits no discharge. Left eye exhibits no discharge. No scleral icterus.  Neck: Normal range of motion. Neck supple.  Cardiovascular: Normal rate and regular rhythm.  Exam reveals no gallop and no friction rub.   No murmur heard. Pulmonary/Chest: Effort normal and breath sounds normal. No stridor. No respiratory distress. She has no rales. She exhibits tenderness.  Right breast exhibits no inverted nipple, no mass, no nipple discharge, no skin change and no tenderness. Left breast exhibits no inverted nipple. There is no breast swelling.    Tenderness to breast and intercostals. No fluctuance or erythema.  Abdominal: Soft. She exhibits no distension. There is no tenderness.  Musculoskeletal: She exhibits no edema or tenderness.  Neurological: She is alert and oriented to person, place, and time.  Skin: Skin is warm and dry. No rash noted. She is not diaphoretic. No erythema.  Psychiatric: She has a normal mood and affect.  Vitals reviewed.    ED Treatments / Results  Labs (all labs ordered are listed, but only abnormal results are displayed) Labs Reviewed  CBC - Abnormal; Notable for the following:       Result Value   Hemoglobin 10.2 (*)    HCT 33.2 (*)    MCH 25.4 (*)    RDW 15.7 (*)    All other components within normal limits  BASIC METABOLIC PANEL  I-STAT TROPOININ, ED    EKG  EKG Interpretation  Date/Time:  Saturday January 12 2017 16:52:52 EDT Ventricular Rate:  82 PR Interval:  128 QRS Duration: 78 QT Interval:  372 QTC Calculation: 434 R Axis:   73 Text Interpretation:  Normal sinus rhythm Normal ECG No old tracing to compare Confirmed by [facility] MD, Samarrah Tranchina [phone]) on 01/12/2017 7:02:30 PM       Radiology Dg Chest 2 View  Result Date: 01/12/2017 CLINICAL DATA:  Worsening chest pain. EXAM: CHEST  2 VIEW COMPARISON:  None available. FINDINGS: Cardiomediastinal silhouette is normal. Mediastinal contours appear intact. There is no evidence of focal airspace consolidation, pleural effusion or pneumothorax. Osseous structures are without acute abnormality. Soft tissues are grossly normal. IMPRESSION: No active cardiopulmonary disease. Electronically Signed   By: Ted Mcalpine M.D.   On: 01/12/2017 17:42    Procedures Procedures (including critical care time) EMERGENCY DEPARTMENT US SOFT TISSUE INTERPRETATION "Study:  Limited Soft Tissue Ultrasound"  INDICATIONS: Pain Multiple views of the body part were obtained in real-time with a multi-frequency linear probe  PERFORMED BY: Myself IMAGES ARCHIVED?: Yes SIDE:Right  BODY PART:Breast INTERPRETATION:  No abcess noted, No cellulitis noted and Normal soft tissue ultrasound    Medications Ordered in ED Medications  ketorolac (TORADOL) 15 MG/ML injection 30 mg (not administered)     Initial Impression / Assessment and Plan / ED Course  I have reviewed the triage vital signs and the nursing notes.  Pertinent labs & imaging results that were available during my care of the patient were reviewed by me and considered in my medical decision making (see chart for details).     Highly atypical chest pain not consistent with ACS. EKG without acute ischemic changes or evidence of pericarditis. Troponin drawn at triage was negative. Other labs were grossly reassuring. Chest x-ray  without evidence suggestive of pneumonia, pneumothorax, pneumomediastinum.  No abnormal contour of the mediastinum to suggest dissection. No evidence of acute injuries.   PERC negative, doubt pulmonary embolism. Presentation is classic for aortic dissection or esophageal perforation. Bedside ultrasound without evidence of soft tissue infection including breast abscess.  Most consistent with chest wall pain.  The patient is safe for discharge with strict return precautions.  Final Clinical Impressions(s) / ED Diagnoses   Final diagnoses:  Chest wall pain   Disposition: Discharge  Condition: Good  I have discussed the results, Dx and Tx plan with the patient who expressed understanding and agree(s) with the plan. Discharge instructions discussed at great length. The patient was given strict return precautions who verbalized understanding of the instructions. No further questions at time of discharge.    New Prescriptions   ACETAMINOPHEN (TYLENOL) 500 MG TABLET    Take 2  tablets (1,000 mg total) by mouth every 8 (eight) hours. Do not take more than 4000 mg of acetaminophen (Tylenol) in a 24-hour period. Please note that other medicines that you may be prescribed may have Tylenol as well.   IBUPROFEN (ADVIL,MOTRIN) 600 MG TABLET    Take 1 tablet (600 mg total) by mouth every 6 (six) hours as needed.    Follow Up: Primary care provider  Schedule an appointment as soon as possible for a visit  in 3-5 days, If symptoms do not improve or  worsen      Nira ConnPedro Eduardo Addilyne Backs, MD 01/12/17 [phone]

## 2017-04-24 ENCOUNTER — Emergency Department (HOSPITAL_COMMUNITY): Payer: BLUE CROSS/BLUE SHIELD

## 2017-04-24 ENCOUNTER — Encounter (HOSPITAL_COMMUNITY): Payer: Self-pay

## 2017-04-24 DIAGNOSIS — Z5321 Procedure and treatment not carried out due to patient leaving prior to being seen by health care provider: Secondary | ICD-10-CM | POA: Insufficient documentation

## 2017-04-24 DIAGNOSIS — R079 Chest pain, unspecified: Secondary | ICD-10-CM | POA: Insufficient documentation

## 2017-04-24 LAB — CBC
HCT: 34.2 % — ABNORMAL LOW (ref 36.0–46.0)
Hemoglobin: 10.3 g/dL — ABNORMAL LOW (ref 12.0–15.0)
MCH: 24.4 pg — ABNORMAL LOW (ref 26.0–34.0)
MCHC: 30.1 g/dL (ref 30.0–36.0)
MCV: 81 fL (ref 78.0–100.0)
PLATELETS: 252 10*3/uL (ref 150–400)
RBC: 4.22 MIL/uL (ref 3.87–5.11)
RDW: 15.5 % (ref 11.5–15.5)
WBC: 7.5 10*3/uL (ref 4.0–10.5)

## 2017-04-24 MED ORDER — OXYCODONE-ACETAMINOPHEN 5-325 MG PO TABS
1.0000 | ORAL_TABLET | ORAL | Status: DC | PRN
  Administered 2017-04-24: 1 via ORAL

## 2017-04-24 MED ORDER — OXYCODONE-ACETAMINOPHEN 5-325 MG PO TABS
ORAL_TABLET | ORAL | Status: AC
  Filled 2017-04-24: qty 1

## 2017-04-24 NOTE — ED Triage Notes (Signed)
Pt states that she started having CP tonight that would not go away, CP has come and gone for the past two days, central with radiation to back and diaphoresis, n/v x 1 and SOB

## 2017-04-25 ENCOUNTER — Emergency Department (HOSPITAL_COMMUNITY)
Admission: EM | Admit: 2017-04-25 | Discharge: 2017-04-25 | Disposition: A | Discharge status: Home / Self Care | Payer: BLUE CROSS/BLUE SHIELD | Attending: Emergency Medicine | Admitting: Emergency Medicine

## 2017-04-25 LAB — BASIC METABOLIC PANEL
Anion gap: 9 (ref 5–15)
BUN: 8 mg/dL (ref 6–20)
CALCIUM: 8.9 mg/dL (ref 8.9–10.3)
CO2: 24 mmol/L (ref 22–32)
CREATININE: 0.94 mg/dL (ref 0.44–1.00)
Chloride: 104 mmol/L (ref 101–111)
GFR calc Af Amer: >60 mL/min (ref >60)
Glucose, Bld: 151 mg/dL — ABNORMAL HIGH (ref 65–99)
POTASSIUM: 3.3 mmol/L — AB (ref 3.5–5.1)
SODIUM: 137 mmol/L (ref 135–145)

## 2017-04-25 LAB — I-STAT TROPONIN, ED: TROPONIN I, POC: 0 ng/mL (ref 0.00–0.08)

## 2017-04-25 NOTE — ED Notes (Signed)
Called pt to re-assess vitals x2 no answer.

## 2017-04-25 NOTE — ED Notes (Signed)
Called pt to re-assess vitals, pt did not answer.  

## 2018-10-06 ENCOUNTER — Encounter (HOSPITAL_COMMUNITY): Payer: Self-pay | Admitting: Emergency Medicine

## 2018-10-06 ENCOUNTER — Emergency Department (HOSPITAL_COMMUNITY)
Admission: EM | Admit: 2018-10-06 | Discharge: 2018-10-06 | Disposition: A | Discharge status: Home / Self Care | Payer: BLUE CROSS/BLUE SHIELD | Attending: Emergency Medicine | Admitting: Emergency Medicine

## 2018-10-06 ENCOUNTER — Emergency Department (HOSPITAL_COMMUNITY): Payer: BLUE CROSS/BLUE SHIELD

## 2018-10-06 ENCOUNTER — Other Ambulatory Visit: Payer: Self-pay

## 2018-10-06 DIAGNOSIS — F17210 Nicotine dependence, cigarettes, uncomplicated: Secondary | ICD-10-CM | POA: Insufficient documentation

## 2018-10-06 DIAGNOSIS — J101 Influenza due to other identified influenza virus with other respiratory manifestations: Secondary | ICD-10-CM | POA: Insufficient documentation

## 2018-10-06 DIAGNOSIS — R079 Chest pain, unspecified: Secondary | ICD-10-CM | POA: Diagnosis present

## 2018-10-06 LAB — CBC WITH DIFFERENTIAL/PLATELET
Abs Immature Granulocytes: 0.02 10*3/uL (ref 0.00–0.07)
BASOS ABS: 0 10*3/uL (ref 0.0–0.1)
BASOS PCT: 0 %
EOS ABS: 0.1 10*3/uL (ref 0.0–0.5)
Eosinophils Relative: 1 %
HCT: 33.6 % — ABNORMAL LOW (ref 36.0–46.0)
Hemoglobin: 9.6 g/dL — ABNORMAL LOW (ref 12.0–15.0)
IMMATURE GRANULOCYTES: 0 %
Lymphocytes Relative: 18 %
Lymphs Abs: 0.9 10*3/uL (ref 0.7–4.0)
MCH: 24.1 pg — ABNORMAL LOW (ref 26.0–34.0)
MCHC: 28.6 g/dL — ABNORMAL LOW (ref 30.0–36.0)
MCV: 84.2 fL (ref 80.0–100.0)
MONOS PCT: 8 %
Monocytes Absolute: 0.4 10*3/uL (ref 0.1–1.0)
NEUTROS PCT: 73 %
NRBC: 0 % (ref 0.0–0.2)
Neutro Abs: 3.7 10*3/uL (ref 1.7–7.7)
Platelets: 254 10*3/uL (ref 150–400)
RBC: 3.99 MIL/uL (ref 3.87–5.11)
RDW: 15 % (ref 11.5–15.5)
WBC: 5 10*3/uL (ref 4.0–10.5)

## 2018-10-06 LAB — INFLUENZA PANEL BY PCR (TYPE A & B)
Influenza A By PCR: POSITIVE — AB
Influenza B By PCR: NEGATIVE

## 2018-10-06 LAB — BASIC METABOLIC PANEL
ANION GAP: 11 (ref 5–15)
BUN: 7 mg/dL (ref 6–20)
CALCIUM: 8.6 mg/dL — AB (ref 8.9–10.3)
CO2: 21 mmol/L — AB (ref 22–32)
CREATININE: 1 mg/dL (ref 0.44–1.00)
Chloride: 105 mmol/L (ref 98–111)
GFR calc Af Amer: >60 mL/min (ref >60)
GFR calc non Af Amer: >60 mL/min (ref >60)
GLUCOSE: 84 mg/dL (ref 70–99)
Potassium: 3.9 mmol/L (ref 3.5–5.1)
Sodium: 137 mmol/L (ref 135–145)

## 2018-10-06 LAB — I-STAT TROPONIN, ED: TROPONIN I, POC: 0 ng/mL (ref 0.00–0.08)

## 2018-10-06 MED ORDER — IBUPROFEN 400 MG PO TABS
400.0000 mg | ORAL_TABLET | Freq: Once | ORAL | Status: AC
  Administered 2018-10-06: 400 mg via ORAL
  Filled 2018-10-06: qty 1

## 2018-10-06 MED ORDER — ACETAMINOPHEN 500 MG PO TABS
1000.0000 mg | ORAL_TABLET | Freq: Once | ORAL | Status: AC
  Administered 2018-10-06: 1000 mg via ORAL
  Filled 2018-10-06: qty 2

## 2018-10-06 MED ORDER — LIDOCAINE VISCOUS HCL 2 % MT SOLN
15.0000 mL | Freq: Once | OROMUCOSAL | Status: AC
  Administered 2018-10-06: 15 mL via ORAL
  Filled 2018-10-06: qty 15

## 2018-10-06 MED ORDER — ONDANSETRON 4 MG PO TBDP: 4.0000 mg | ORAL_TABLET | Freq: Three times a day (TID) | ORAL | 0 refills | Status: AC | PRN

## 2018-10-06 MED ORDER — ALUM & MAG HYDROXIDE-SIMETH 200-200-20 MG/5ML PO SUSP
30.0000 mL | Freq: Once | ORAL | Status: AC
  Administered 2018-10-06: 30 mL via ORAL
  Filled 2018-10-06: qty 30

## 2018-10-06 MED ORDER — SODIUM CHLORIDE 0.9 % IV BOLUS
1000.0000 mL | Freq: Once | INTRAVENOUS | Status: AC
  Administered 2018-10-06: 1000 mL via INTRAVENOUS

## 2018-10-06 NOTE — ED Provider Notes (Signed)
[facility] EMERGENCY DEPARTMENT Provider Note   CSN: [id number] Arrival date & time: 10/06/18  0911     History   Chief Complaint Chief Complaint  Patient presents with  . flu like symptoms  . Cough  . Emesis  . Sore Throat  . Chest Pain    HPI Nancy Hull is a 42 y.o. female with no significant PMH presenting today with four days of non-radiating chest pain, night sweats, nausea, and vomiting that wake her from sleep with vomiting lasting around 45 minutes. She states last night at work she began to feel even worse. She endorses feeling feverish, dry cough, pleuritic central chest pain, pain with swallowing. She has been unable to eat or drink much in the past few days. She did get the flu shot from work.  She denies sick contacts that she knows of or recent travel. She denies dysuria, constipation, diarrhea. Denies arm or jaw pain, numbness or tingling.   HPI  History reviewed. No pertinent past medical history.  There are no active problems to display for this patient.   Past Surgical History:  Procedure Laterality Date  . CESAREAN SECTION       OB History   None      Home Medications    Prior to Admission medications   Medication Sig Start Date End Date Taking? Authorizing Provider  ibuprofen (ADVIL,MOTRIN) 600 MG tablet Take 1 tablet (600 mg total) by mouth every 6 (six) hours as needed. Patient taking differently: Take 600 mg by mouth every 6 (six) hours as needed for mild pain or moderate pain.  01/12/17  Yes Cardama, Amadeo Garnet, MD  amoxicillin (AMOXIL) 500 MG capsule Take 1 capsule (500 mg total) by mouth 3 (three) times daily. Patient not taking: Reported on 10/06/2018 07/20/16   Tharon Aquas, PA  lidocaine (XYLOCAINE) 2 % solution Use as directed 15 mLs in the mouth or throat as needed for mouth pain. Patient not taking: Reported on 10/06/2018 07/05/16   Joy, Ines Bloomer C, PA-C  naproxen (NAPROSYN) 500 MG tablet Take 1 tablet  (500 mg total) by mouth 2 (two) times daily. Patient not taking: Reported on 10/06/2018 07/05/16   Joy, Shawn C, PA-C  ondansetron (ZOFRAN ODT) 4 MG disintegrating tablet Take 1 tablet (4 mg total) by mouth every 8 (eight) hours as needed for up to 3 days for nausea or vomiting. 10/06/18 10/09/18  Wake Conlee A, DO  ondansetron (ZOFRAN) 4 MG tablet Take 1 tablet (4 mg total) by mouth every 6 (six) hours. Patient not taking: Reported on 10/06/2018 07/20/16   Tharon Aquas, PA  oxyCODONE-acetaminophen (PERCOCET) 10-325 MG tablet Take 1 tablet by mouth every 4 (four) hours as needed for pain. Patient not taking: Reported on 10/06/2018 07/20/16   Tharon Aquas, PA    Family History No family history on file.  Social History Social History   Tobacco Use  . Smoking status: Current Every Day Smoker    Packs/day: 0.50    Types: Cigarettes  . Smokeless tobacco: Never Used  Substance Use Topics  . Alcohol use: Yes  . Drug use: No     Allergies   Patient has no known allergies.   Review of Systems Review of Systems  Constitutional: Positive for appetite change, chills, diaphoresis, fatigue and fever.  HENT: Positive for sore throat. Negative for ear pain, sinus pressure, sinus pain and sneezing.   Eyes: Negative for photophobia and redness.  Respiratory: Negative for chest  tightness and shortness of breath.   Cardiovascular: Positive for chest pain. Negative for leg swelling.  Gastrointestinal: Positive for nausea and vomiting. Negative for abdominal pain, constipation and diarrhea.  Genitourinary: Negative for decreased urine volume, dysuria, flank pain, frequency and urgency.  Musculoskeletal: Positive for back pain and myalgias.  Neurological: Negative for dizziness and light-headedness.   Ten systems reviewed and are negative for acute change, except as noted in the HPI.    Physical Exam Updated Vital Signs BP 112/61 (BP Location: Right Arm)   Pulse 79   Temp 98.8 F (37.1  C) (Oral)   Resp 16   LMP 10/06/2018   SpO2 98%   Physical Exam  Constitutional: She is oriented to person, place, and time. She appears ill. No distress.  HENT:  Head: Normocephalic and atraumatic.  Eyes: EOM are normal.  Cardiovascular: Normal rate and regular rhythm. Exam reveals no gallop.  No murmur heard. Pulmonary/Chest: Effort normal and breath sounds normal. She has no wheezes. She has no rhonchi. She has no rales.  Abdominal: Soft. Bowel sounds are normal. She exhibits no distension. There is no tenderness.  Musculoskeletal:       Right lower leg: She exhibits no edema.       Left lower leg: She exhibits no edema.  Neurological: She is alert and oriented to person, place, and time.  Skin: Skin is warm. She is diaphoretic.     ED Treatments / Results  Labs (all labs ordered are listed, but only abnormal results are displayed) Labs Reviewed  CBC WITH DIFFERENTIAL/PLATELET - Abnormal; Notable for the following components:      Result Value   Hemoglobin 9.6 (*)    HCT 33.6 (*)    MCH 24.1 (*)    MCHC 28.6 (*)    All other components within normal limits  BASIC METABOLIC PANEL - Abnormal; Notable for the following components:   CO2 21 (*)    Calcium 8.6 (*)    All other components within normal limits  INFLUENZA PANEL BY PCR (TYPE A & B) - Abnormal; Notable for the following components:   Influenza A By PCR POSITIVE (*)    All other components within normal limits  I-STAT TROPONIN, ED    EKG EKG Interpretation  Date/Time:  Monday October 06 2018 09:29:40 EST Ventricular Rate:  73 PR Interval:    QRS Duration: 82 QT Interval:  400 QTC Calculation: 441 R Axis:   50 Text Interpretation:  Sinus rhythm Abnormal R-wave progression, early transition No significant change since last tracing Confirmed by Jacalyn LefevreHaviland, Julie [phone]([id number]) on 10/06/2018 9:40:00 AM   Radiology Dg Chest 2 View  Result Date: 10/06/2018 CLINICAL DATA:  Chest pain with cough and fever EXAM:  CHEST - 2 VIEW COMPARISON:  April 24, 2017 FINDINGS: Lungs are clear. Heart size and pulmonary vascularity are normal. No adenopathy. No pneumothorax. No bone lesions. IMPRESSION: No edema or consolidation. Electronically Signed   By: Bretta BangWilliam  Woodruff III M.D.   On: 10/06/2018 10:54    Procedures Procedures (including critical care time)  Medications Ordered in ED Medications  sodium chloride 0.9 % bolus 1,000 mL (0 mLs Intravenous Stopped 10/06/18 1110)  alum & mag hydroxide-simeth (MAALOX/MYLANTA) 200-200-20 MG/5ML suspension 30 mL (30 mLs Oral Given 10/06/18 1108)    And  lidocaine (XYLOCAINE) 2 % viscous mouth solution 15 mL (15 mLs Oral Given 10/06/18 1108)  acetaminophen (TYLENOL) tablet 1,000 mg (1,000 mg Oral Given 10/06/18 1107)  ibuprofen (ADVIL,MOTRIN) tablet 400 mg (  400 mg Oral Given 10/06/18 1303)     Initial Impression / Assessment and Plan / ED Course  I have reviewed the triage vital signs and the nursing notes.  Pertinent labs & imaging results that were available during my care of the patient were reviewed by me and considered in my medical decision making (see chart for details).  Clinical Course as of Oct 06 1536  Mon Oct 06, 2018  4098 Four days of vomiting, diaphoresis, dry cough occurring in the evenings. Has not been able to eat or drink much. Given 1L bolus and testing for flu.    [JS]    Clinical Course User Index [JS] Kieara Schwark A, DO    Patient's flu test returned positive for Influenza A. Given 1L NS bolus. She is stable for discharge and is not currently having nausea, states she feels able to eat and drink. Will send with zofran for nausea and recommend tylenol and sudafed as needed. Educated patient on wearing mask outside of the house and to stay home from work until symptoms improve.  She is accompanied by her partner, who is pregnant. Discussed with patient and partner she will need to call her Ob today for possible prophylactic treatment and further  recommendations.   Final Clinical Impressions(s) / ED Diagnoses   Final diagnoses:  Influenza A    ED Discharge Orders         Ordered    ondansetron (ZOFRAN ODT) 4 MG disintegrating tablet  Every 8 hours PRN     10/06/18 1250           Anyelo Mccue A, DO 10/06/18 1537    Jacalyn Lefevre, MD 10/06/18 1555

## 2018-10-06 NOTE — ED Triage Notes (Signed)
C/o body aches, chest pain with resp, cough- dry at present, nonproductive, for past few days Took Motrin 400mg  at 0330-

## 2018-10-06 NOTE — Discharge Instructions (Addendum)
Thank you for allowing us to be part of your care.   I have prescribed ondansetron (Zofran) 4 mg every 8 hours as needed for nausea or vomiting.  Please take tylenol 650 mg every six hours as needed for fever, chills, and pain. Do not exceed 4,000 mg in a 24 hour period.  You can also take Sudafed as needed for cough, congestion and chills. This medication can be found over the counter at your local pharmacy.   Stay home from the work for the next few days as you will be contagious and wear a mask when around people.

## 2018-10-09 ENCOUNTER — Emergency Department (HOSPITAL_COMMUNITY)
Admission: EM | Admit: 2018-10-09 | Discharge: 2018-10-09 | Disposition: A | Discharge status: Home / Self Care | Payer: BLUE CROSS/BLUE SHIELD | Attending: Emergency Medicine | Admitting: Emergency Medicine

## 2018-10-09 ENCOUNTER — Encounter (HOSPITAL_COMMUNITY): Payer: Self-pay

## 2018-10-09 DIAGNOSIS — J101 Influenza due to other identified influenza virus with other respiratory manifestations: Secondary | ICD-10-CM | POA: Diagnosis not present

## 2018-10-09 DIAGNOSIS — F17210 Nicotine dependence, cigarettes, uncomplicated: Secondary | ICD-10-CM | POA: Insufficient documentation

## 2018-10-09 DIAGNOSIS — R05 Cough: Secondary | ICD-10-CM | POA: Diagnosis present

## 2018-10-09 DIAGNOSIS — R059 Cough, unspecified: Secondary | ICD-10-CM

## 2018-10-09 MED ORDER — BENZONATATE 100 MG PO CAPS: 100.0000 mg | ORAL_CAPSULE | Freq: Three times a day (TID) | ORAL | 0 refills | Status: AC

## 2018-10-09 MED ORDER — IBUPROFEN 800 MG PO TABS
800.0000 mg | ORAL_TABLET | Freq: Once | ORAL | Status: AC
  Administered 2018-10-09: 800 mg via ORAL
  Filled 2018-10-09: qty 1

## 2018-10-09 MED ORDER — BENZONATATE 100 MG PO CAPS: 100.0000 mg | ORAL_CAPSULE | Freq: Three times a day (TID) | ORAL | 0 refills | Status: DC

## 2018-10-09 MED ORDER — ACETAMINOPHEN 325 MG PO TABS
650.0000 mg | ORAL_TABLET | Freq: Once | ORAL | Status: AC
  Administered 2018-10-09: 650 mg via ORAL
  Filled 2018-10-09: qty 2

## 2018-10-09 MED ORDER — ACETAMINOPHEN 500 MG PO TABS: 1000.0000 mg | ORAL_TABLET | Freq: Four times a day (QID) | ORAL | 0 refills | Status: AC | PRN

## 2018-10-09 NOTE — ED Provider Notes (Signed)
[facility]ENC[facility] Provider Note   CSN: [id number] Arrival date & time: 10/09/18  [id number]     History   Chief Complaint Chief Complaint  Patient presents with  . recheck flu symptoms    HPI Nancy Hull is a 42 y.o. female who presents emergency room today for continued symptoms.  Patient was seen here on 12/9 for fever, chills, body aches, cough and chest pain.  Patient had reassuring lab work and was noted to be flu A+.  Patient is outside of the window for Tamiflu.  Patient was discharged home with Zofran recommended take care medication for fever and body aches.  Her [facility] that is pregnant [facility] and [facility] Tamiflu which she is taking.  The patient reports that she has continued nonproductive cough, nasal congestion, fever and body aches.  The patient notes that she has been taking 500 mg Tylenol twice daily and 200 mg of Motrin twice daily.  They are alternating every 6 hours and do not feel it is helping.  He will occasionally use Robitussin.  Patient is requesting something for fever and body aches.  She denies any neck stiffness, visual changes, photophobia, rash, shortness of breath, abdominal pain, dysuria.  She notes 1 episode of diarrhea yesterday.  None today.  No emesis or diarrhea.  No other complaints. Last dose of tylenol was at 4am. No NSAIDs pta.   HPI  History reviewed. No pertinent past medical history.  There are no active problems to display for this patient.   Past Surgical History:  Procedure Laterality Date  . CESAREAN SECTION       OB History   No obstetric history on file.      Home Medications    Prior to Admission medications   Medication Sig Start Date End Date Taking? Authorizing Provider  amoxicillin (AMOXIL) 500 MG capsule Take 1 capsule (500 mg total) by mouth 3 (three) times daily. Patient not taking: Reported on 10/06/2018 07/20/16   Tharon AquasPatrick, Frank C, PA  ibuprofen (ADVIL,MOTRIN) 600 MG  tablet Take 1 tablet (600 mg total) by mouth every 6 (six) hours as needed. Patient taking differently: Take 600 mg by mouth every 6 (six) hours as needed for mild pain or moderate pain.  01/12/17   Cardama, Amadeo GarnetPedro Eduardo, MD  lidocaine (XYLOCAINE) 2 % solution Use as directed 15 mLs in the mouth or throat as needed for mouth pain. Patient not taking: Reported on 10/06/2018 07/05/16   Joy, Ines BloomerShawn C, PA-C  naproxen (NAPROSYN) 500 MG tablet Take 1 tablet (500 mg total) by mouth 2 (two) times daily. Patient not taking: Reported on 10/06/2018 07/05/16   Joy, Shawn C, PA-C  ondansetron (ZOFRAN ODT) 4 MG disintegrating tablet Take 1 tablet (4 mg total) by mouth every 8 (eight) hours as needed for up to 3 days for nausea or vomiting. 10/06/18 10/09/18  Seawell, Jaimie A, DO  ondansetron (ZOFRAN) 4 MG tablet Take 1 tablet (4 mg total) by mouth every 6 (six) hours. Patient not taking: Reported on 10/06/2018 07/20/16   Tharon AquasPatrick, Frank C, PA  oxyCODONE-acetaminophen (PERCOCET) 10-325 MG tablet Take 1 tablet by mouth every 4 (four) hours as needed for pain. Patient not taking: Reported on 10/06/2018 07/20/16   Tharon AquasPatrick, Frank C, PA    Family History No family history on file.  Social History Social History   Tobacco Use  . Smoking status: Current Every Day Smoker    Packs/day: 0.50  Types: Cigarettes  . Smokeless tobacco: Never Used  Substance Use Topics  . Alcohol use: Yes  . Drug use: No     Allergies   Patient has no known allergies.   Review of Systems Review of Systems  All other systems reviewed and are negative.    Physical Exam Updated Vital Signs BP 132/72 (BP Location: Right Arm)   Pulse 69   Temp (!) 102.4 F (39.1 C) (Oral)   Resp (!) 32   LMP 10/06/2018   SpO2 100%   Physical Exam Vitals signs and nursing note reviewed.  Constitutional:      Appearance: She is well-developed. She is not diaphoretic.  HENT:     Head: Normocephalic and atraumatic.     Right Ear: External ear  normal.     Left Ear: External ear normal.     Nose: Nose normal.     Mouth/Throat:     Pharynx: Uvula midline.     Tonsils: No tonsillar exudate.  Eyes:     General: No scleral icterus.       Right eye: No discharge.        Left eye: No discharge.     Pupils: Pupils are equal, round, and reactive to light.  Neck:     Musculoskeletal: Neck supple. Normal range of motion. No neck rigidity or spinous process tenderness.     Trachea: Trachea normal.     Comments: No nuchal rigidity or meningismus Cardiovascular:     Rate and Rhythm: Normal rate and regular rhythm.     Pulses:          Radial pulses are 2+ on the right side and 2+ on the left side.       Dorsalis pedis pulses are 2+ on the right side and 2+ on the left side.       Posterior tibial pulses are 2+ on the right side and 2+ on the left side.     Heart sounds: No murmur.     Comments: No lower extremity swelling or edema. Calves symmetric in size bilaterally. Pulmonary:     Effort: Pulmonary effort is normal.     Breath sounds: Normal breath sounds.     Comments: No increased work of breathing. No accessory muscle use. Patient is sitting upright, speaking in full sentences without difficulty  Chest:     Chest wall: No tenderness.  Abdominal:     General: Bowel sounds are normal.     Palpations: Abdomen is soft.     Tenderness: There is no abdominal tenderness. There is no guarding or rebound.  Lymphadenopathy:     Cervical: No cervical adenopathy.  Skin:    General: Skin is warm and dry.     Capillary Refill: Capillary refill takes less than 2 seconds.     Findings: No rash.     Comments: No rash  Neurological:     Mental Status: She is alert.      ED Treatments / Results  Labs (all labs ordered are listed, but only abnormal results are displayed) Labs Reviewed - No data to display  EKG None  Radiology No results found.  Procedures Procedures (including critical care time)  Medications Ordered in  ED Medications  ibuprofen (ADVIL,MOTRIN) tablet 800 mg (800 mg Oral Given 10/09/18 0946)  acetaminophen (TYLENOL) tablet 650 mg (650 mg Oral Given 10/09/18 0946)     Initial Impression / Assessment and Plan / ED Course  I have reviewed the triage  vital signs and the nursing notes.  Pertinent labs & imaging results that were available during my care of the patient were reviewed by me and considered in my medical decision making (see chart for details).     42 y.o. female presenting with continued fever, nasal congestion, cough, body aches after being tested positive for flu a on 12/9. Patients vital signs are with fever but no tachycardia on presentation.  Patient is satting 100% on room air.  No hypotension.  Patient ear exam without evidence of AOM. No meningeal signs.  Mastoid without erythema, edema or tenderness.  Oropharynx is clear. Do not suspect strep.  No rash.  Lungs are clear to auscultation bilaterally. Do not suspect PNA.  No wheezing.  Abdomen is soft, nondistended and without tenderness. Do not suspect intra-abdominal pathology. Patient without reported urinary symptoms.  I suspect the patient's symptoms and fever are related to influenza A.  Lab work reviewed from prior visit and reassuring.  Do not feel patient needs further imaging or lab work at this time.  Patient is not receiving adequate doses of Tylenol or ibuprofen at home.  I educated the patient that she can take 1000 mg of Tylenol every 6 hours and 800 mg of ibuprofen every 8 hours for fever and body aches.  Will give Tessalon for cough.  Discussed with patient to make sure other medications takes over-the-counter do not contain Tylenol or ibuprofen. Patient was given tylenol and ibuprofen in the department. Note provided for work. I advised the patient to follow-up with pcp this week. Specific return precautions discussed. Time was given for all questions to be answered. The patient verbalized understanding and agreement  with plan. The patient appears safe for discharge home.  Final Clinical Impressions(s) / ED Diagnoses   Final diagnoses:  Influenza A  Cough    ED Discharge Orders         Ordered    acetaminophen (TYLENOL) 500 MG tablet  Every 6 hours PRN     10/09/18 1001           Princella Pellegrini 10/09/18 1002    Raeford Razor, MD 10/10/18 1702

## 2018-10-09 NOTE — ED Notes (Signed)
Pt verbalized understanding of d/c instructions and has no further questions. Pt is to take 800mg  of ibuprofen 3 times per day and interchanged with tylenol to control fever.

## 2018-10-09 NOTE — Discharge Instructions (Addendum)
Please read and follow all provided instructions.  You were seen here today for your symptoms. Your diagnosis today is Influenza (Flu).    For pain and fever control you may take: 800mg  of ibuprofen (that is usually four 200mg  over the counter pills) up to 3 times a day (every 8 hours, please take with food) and acetaminophen 1000mg  (as prescribed) up to four times a day. Please do not take more than this. Do not drink alcohol or combine with other medications that have acetaminophen or Ibuprofen as an ingredient (Read the labels!).   Followup with your doctor in regards to your hospital visit. If you do not have a doctor use the resource guide listed below to help he find one. You may return to the emergency department if symptoms worsen, become progressive, or become more concerning. Read below to learn more about your diagnosis & reasons to return. Use Tylenol to treat your fever   Influenza, Adult  Influenza (flu) starts suddenly, usually with a fever. It causes chills, a dry-hacking cough, headache, body aches, and sore throat. Influenza spreads easily from one person to another. Risk of transmission is lowered by getting the flu shot each year.  HOME CARE  Only take medicines as told by your doctor.  Rest.  Drink enough fluids to keep your pee (urine) clear or pale yellow.  Wash your hands often. Do this after you blow your nose, after you go to the bathroom, and before you touch food.  GET HELP RIGHT AWAY IF (return to the ED) You have shortness of breath while resting.  You have pain or pressure in the chest or belly (abdomen).  You suddenly feel dizzy.  You feel confused.  You have a hard time breathing.  Your skin or nails turn bluish in color.  You get a bad neck pain or stiffness.  You get a bad headache, face pain, or earache.  You throw up (vomit) a lot and often.  You have a fever > 101 that persists  Additional Information:

## 2018-10-09 NOTE — ED Triage Notes (Signed)
Patient complains of ongoing body aches, fever, cough x 4 days. Seen on 12/9 for same. Alert and oriented

## 2019-09-20 IMAGING — DX DG CHEST 2V
2 series · 2 of 2 positions shown · non-contrast
Comparison: April 24, 2017

CLINICAL DATA: Chest pain with cough and fever

EXAM:
CHEST - 2 VIEW

[x chest ap]
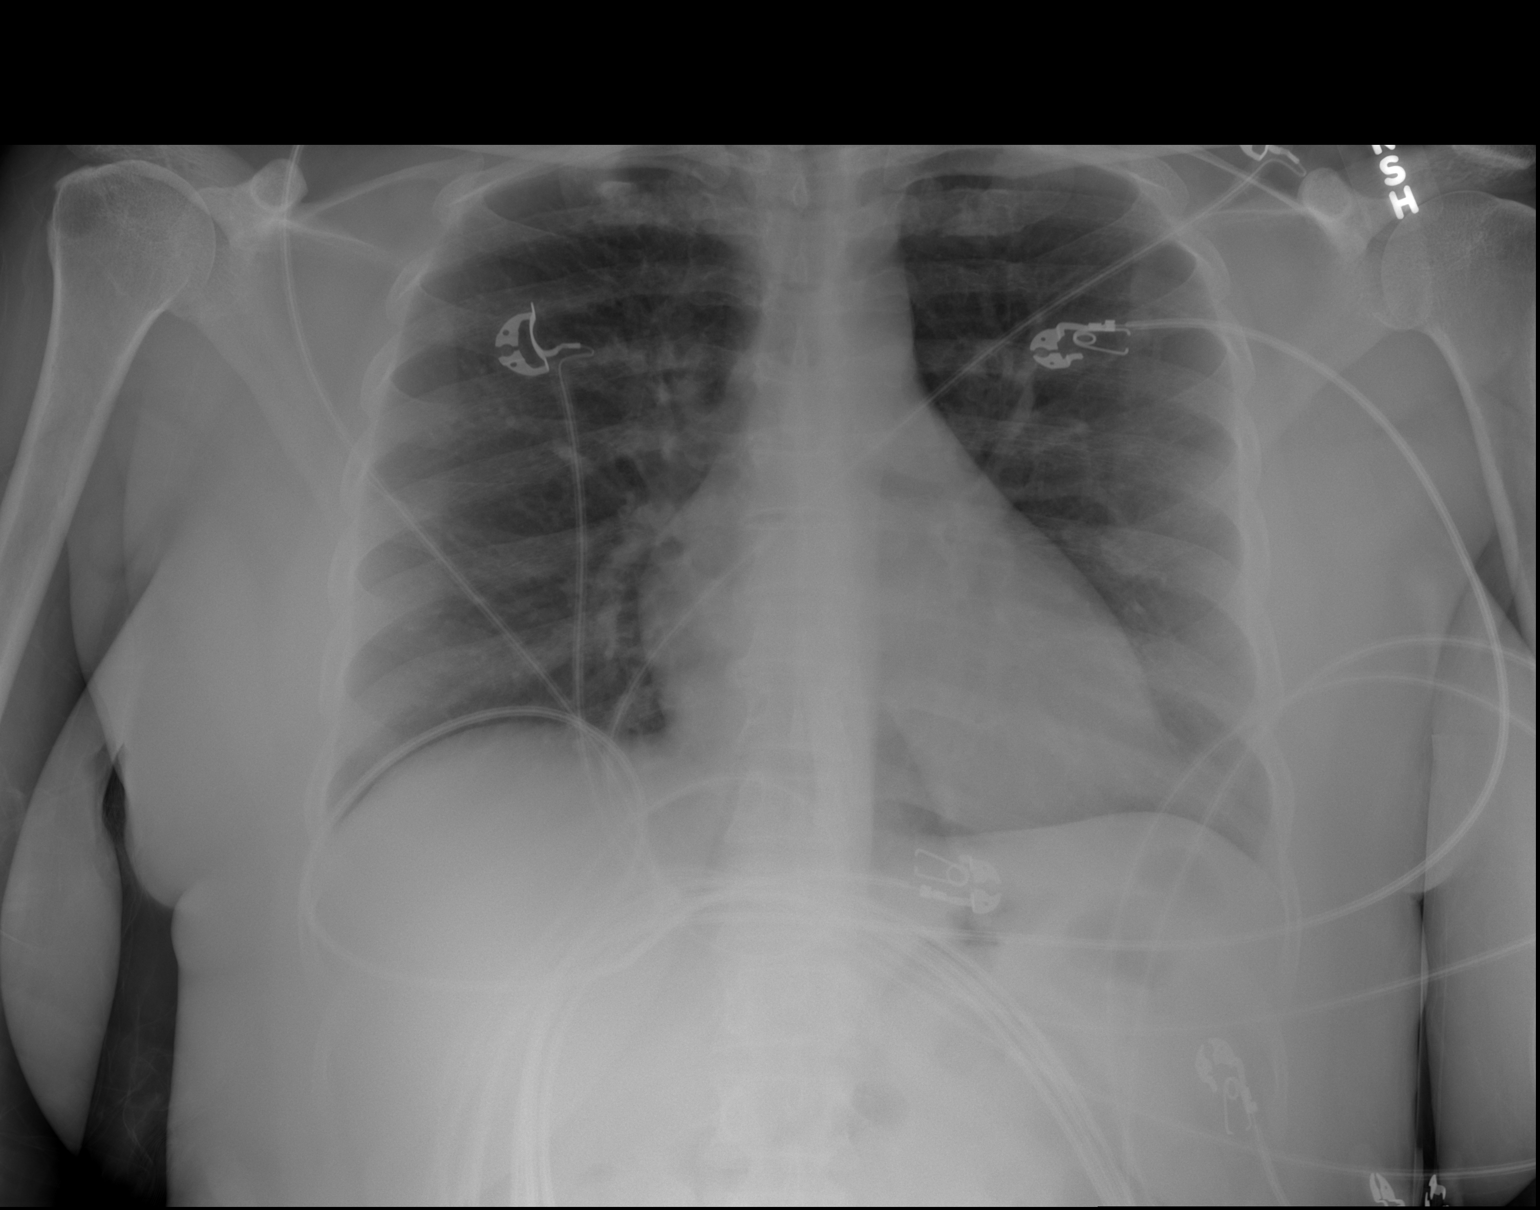

[w chest lat]
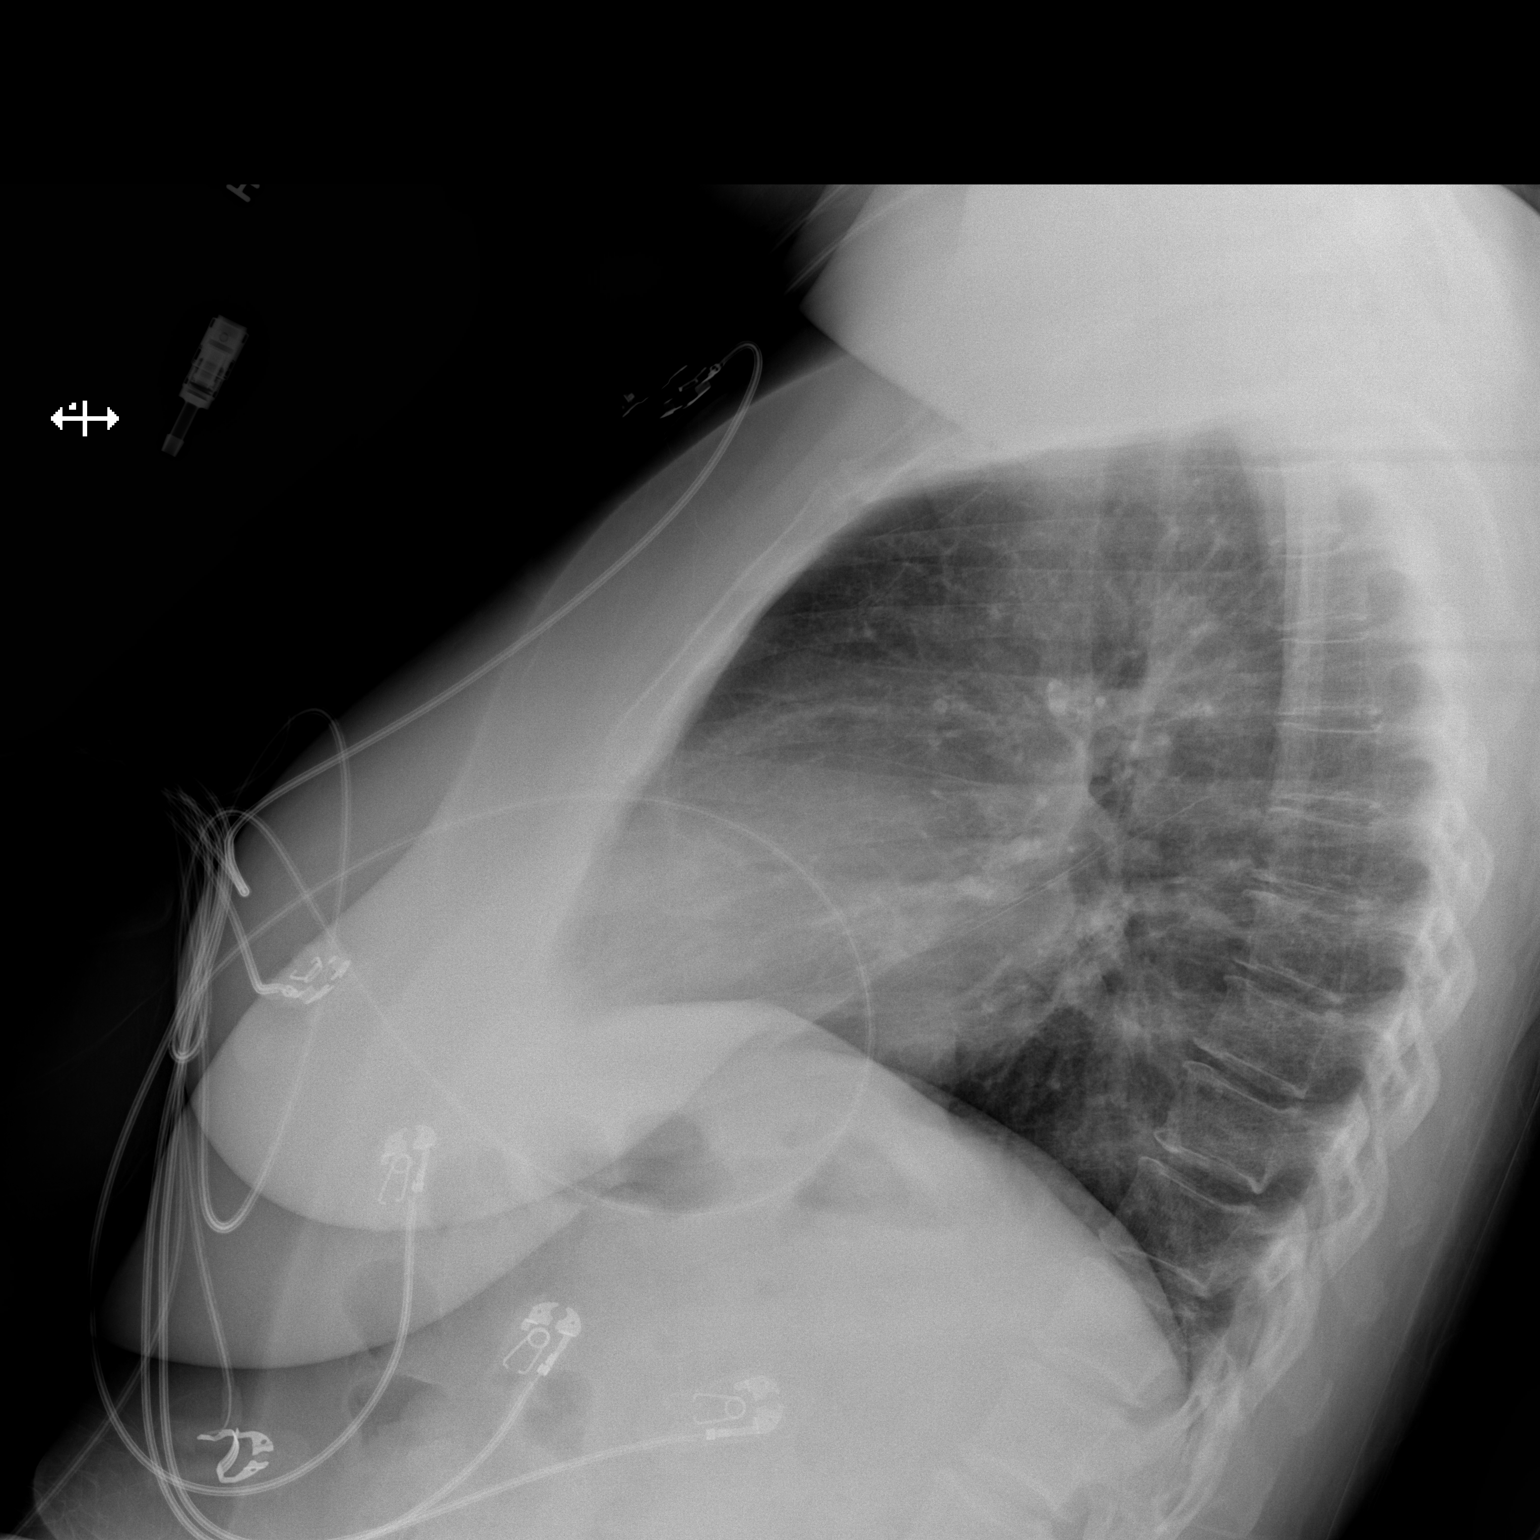

[2 of 2 positions shown; findings below may reference images not displayed]

FINDINGS: Lungs are clear. Heart size and pulmonary vascularity are normal. No
adenopathy. No pneumothorax. No bone lesions.
IMPRESSION: No edema or consolidation.

## 2019-10-17 ENCOUNTER — Emergency Department (HOSPITAL_BASED_OUTPATIENT_CLINIC_OR_DEPARTMENT_OTHER)
Admission: EM | Admit: 2019-10-17 | Discharge: 2019-10-17 | Disposition: A | Discharge status: Home / Self Care | Payer: BC Managed Care – PPO | Attending: Emergency Medicine | Admitting: Emergency Medicine

## 2019-10-17 ENCOUNTER — Other Ambulatory Visit: Payer: Self-pay

## 2019-10-17 ENCOUNTER — Encounter (HOSPITAL_BASED_OUTPATIENT_CLINIC_OR_DEPARTMENT_OTHER): Payer: Self-pay

## 2019-10-17 DIAGNOSIS — N611 Abscess of the breast and nipple: Secondary | ICD-10-CM | POA: Diagnosis not present

## 2019-10-17 DIAGNOSIS — F17210 Nicotine dependence, cigarettes, uncomplicated: Secondary | ICD-10-CM | POA: Diagnosis not present

## 2019-10-17 DIAGNOSIS — N644 Mastodynia: Secondary | ICD-10-CM | POA: Diagnosis present

## 2019-10-17 MED ORDER — PROBIOTIC 250 MG PO CAPS: 250.0000 mg | ORAL_CAPSULE | Freq: Every day | ORAL | 0 refills | Status: AC

## 2019-10-17 MED ORDER — LIDOCAINE-EPINEPHRINE (PF) 2 %-1:200000 IJ SOLN
10.0000 mL | Freq: Once | INTRAMUSCULAR | Status: AC
  Administered 2019-10-17: 10 mL
  Filled 2019-10-17: qty 10

## 2019-10-17 MED ORDER — IBUPROFEN 600 MG PO TABS: 600.0000 mg | ORAL_TABLET | Freq: Four times a day (QID) | ORAL | 0 refills | Status: AC | PRN

## 2019-10-17 MED ORDER — OXYCODONE-ACETAMINOPHEN 5-325 MG PO TABS
1.0000 | ORAL_TABLET | Freq: Once | ORAL | Status: AC
  Administered 2019-10-17: 1 via ORAL
  Filled 2019-10-17: qty 1

## 2019-10-17 MED ORDER — CLINDAMYCIN HCL 150 MG PO CAPS: 450.0000 mg | ORAL_CAPSULE | Freq: Three times a day (TID) | ORAL | 0 refills | Status: AC

## 2019-10-17 MED ORDER — CLINDAMYCIN HCL 150 MG PO CAPS
450.0000 mg | ORAL_CAPSULE | Freq: Once | ORAL | Status: AC
  Administered 2019-10-17: 450 mg via ORAL
  Filled 2019-10-17: qty 3

## 2019-10-17 NOTE — Discharge Instructions (Addendum)
Take clindamycin as prescribed until completed.  While taking clindamycin, I recommend taking a probiotic to help prevent stomach upset.  Take ibuprofen every 6 hours as needed for your pain.  You can alternate with Tylenol as prescribed over-the-counter for this.  Return in 5 to 7 days for wound recheck and packing removal.  Make sure to keep wound dry for 24 hours.  Try to be gentle when you do bathe as to not pull out the packing.  Please return to the emergency department if you develop any significant increase and redness, swelling, red streaking away from the area, drainage from your nipple, or persistent fever of 100.4.

## 2019-10-17 NOTE — ED Notes (Signed)
ED Provider at bedside. Trifan MD & PA.

## 2019-10-17 NOTE — ED Triage Notes (Signed)
Pt c/o painful lump in her L breast that appeared suddenly. Pt thinks it is an abscess.

## 2019-10-17 NOTE — ED Provider Notes (Signed)
[facility] EMERGENCY DEPARTMENT Provider Note   CSN: [id number] Arrival date & time: 10/17/19  1920     History Chief Complaint  Patient presents with  . Breast Pain    Nancy Hull NorwayShonta Hull is a 43 y.o. female who presents with left breast abscess that has been present for 4 days.  Patient has no history of abscesses.  She has had significant pain.  She has not tried anything at home for symptoms.  She denies any fever, nausea, or any other concerns.  She denies any other chest pain, trouble breathing, abdominal pain.  No history of MRSA.  HPI     History reviewed. No pertinent past medical history.  There are no problems to display for this patient.   Past Surgical History:  Procedure Laterality Date  . CESAREAN SECTION       OB History   No obstetric history on file.     No family history on file.  Social History   Tobacco Use  . Smoking status: Current Every Day Smoker    Packs/day: 0.50    Types: Cigarettes  . Smokeless tobacco: Never Used  Substance Use Topics  . Alcohol use: Not Currently  . Drug use: No    Home Medications Prior to Admission medications   Medication Sig Start Date End Date Taking? Authorizing Provider  acetaminophen (TYLENOL) 500 MG tablet Take 2 tablets (1,000 mg total) by mouth every 6 (six) hours as needed. 10/09/18   Maczis, Elmer SowMichael M, PA-C  benzonatate (TESSALON) 100 MG capsule Take 1 capsule (100 mg total) by mouth every 8 (eight) hours. 10/09/18   Maczis, Elmer SowMichael M, PA-C  clindamycin (CLEOCIN) 150 MG capsule Take 3 capsules (450 mg total) by mouth 3 (three) times daily. 10/17/19   Valecia Beske, Waylan BogaAlexandra M, PA-C  ibuprofen (ADVIL) 600 MG tablet Take 1 tablet (600 mg total) by mouth every 6 (six) hours as needed. 10/17/19   Juliette Standre, Waylan BogaAlexandra M, PA-C  lidocaine (XYLOCAINE) 2 % solution Use as directed 15 mLs in the mouth or throat as needed for mouth pain. Patient not taking: Reported on 10/06/2018 07/05/16   Anselm PancoastJoy, Shawn C, PA-C    Saccharomyces boulardii (PROBIOTIC) 250 MG CAPS Take 250 mg by mouth daily. 10/17/19   Emi HolesLaw, Anneta Rounds M, PA-C    Allergies    Patient has no known allergies.  Review of Systems   Review of Systems  Constitutional: Negative for fever.  Gastrointestinal: Negative for nausea.  Skin: Positive for wound.    Physical Exam Updated Vital Signs BP 140/62 (BP Location: Left Arm)   Pulse 82   Temp 99.2 F (37.3 C) (Oral)   Resp 20   Ht 5\' 2"  (1.575 m)   Wt 73.5 kg   LMP 09/14/2019   SpO2 99%   BMI 29.63 kg/m   Physical Exam Vitals and nursing note reviewed.  Constitutional:      General: She is not in acute distress.    Appearance: She is well-developed. She is not diaphoretic.  HENT:     Head: Normocephalic and atraumatic.     Mouth/Throat:     Pharynx: No oropharyngeal exudate.  Eyes:     General: No scleral icterus.       Right eye: No discharge.        Left eye: No discharge.     Conjunctiva/sclera: Conjunctivae normal.     Pupils: Pupils are equal, round, and reactive to light.  Neck:     Thyroid: No thyromegaly.  Cardiovascular:     Rate and Rhythm: Normal rate and regular rhythm.     Heart sounds: Normal heart sounds. No murmur. No friction rub. No gallop.   Pulmonary:     Effort: Pulmonary effort is normal. No respiratory distress.     Breath sounds: Normal breath sounds. No stridor. No wheezing or rales.  Chest:    Abdominal:     General: Bowel sounds are normal. There is no distension.     Palpations: Abdomen is soft.     Tenderness: There is no abdominal tenderness. There is no guarding or rebound.  Musculoskeletal:     Cervical back: Normal range of motion and neck supple.  Lymphadenopathy:     Cervical: No cervical adenopathy.  Skin:    General: Skin is warm and dry.     Coloration: Skin is not pale.     Findings: No rash.  Neurological:     Mental Status: She is alert.     Coordination: Coordination normal.     ED Results / Procedures /  Treatments   Labs (all labs ordered are listed, but only abnormal results are displayed) Labs Reviewed - No data to display  EKG None  Radiology No results found.  Procedures .Marland KitchenIncision and Drainage  Date/Time: 10/17/2019 8:49 PM Performed by: Frederica Kuster, PA-C Authorized by: Frederica Kuster, PA-C   Consent:    Consent obtained:  Verbal   Consent given by:  Patient   Risks discussed:  Bleeding, incomplete drainage, damage to other organs, pain and infection   Alternatives discussed:  Referral Location:    Type:  Abscess   Size:  4   Location:  Trunk   Trunk location:  L breast Pre-procedure details:    Skin preparation:  Chloraprep Anesthesia (see MAR for exact dosages):    Anesthesia method:  Local infiltration   Local anesthetic:  Lidocaine 2% WITH epi Procedure type:    Complexity:  Simple Procedure details:    Needle aspiration: no     Incision types:  Single straight   Incision depth:  Dermal   Scalpel blade:  11   Wound management:  Probed and deloculated and irrigated with saline   Drainage:  Purulent   Drainage amount:  Copious   Wound treatment:  Wound left open   Packing materials:  1/4 in gauze   Amount 1/4":  15cm Post-procedure details:    Patient tolerance of procedure:  Tolerated well, no immediate complications Ultrasound ED Soft Tissue  Date/Time: 10/17/2019 8:51 PM Performed by: Frederica Kuster, PA-C Authorized by: Frederica Kuster, PA-C   Procedure details:    Indications: localization of abscess and evaluate for cellulitis     Transverse view:  Visualized   Longitudinal view:  Visualized   Images: archived   Location:    Location: breast     Side:  Left Findings:     abscess present    cellulitis present    no foreign body present   (including critical care time)  Medications Ordered in ED Medications  clindamycin (CLEOCIN) capsule 450 mg (has no administration in time range)  oxyCODONE-acetaminophen (PERCOCET/ROXICET)  5-325 MG per tablet 1 tablet (1 tablet Oral Given 10/17/19 1959)  lidocaine-EPINEPHrine (XYLOCAINE W/EPI) 2 %-1:200000 (PF) injection 10 mL (10 mLs Infiltration Given 10/17/19 1959)    ED Course  I have reviewed the triage vital signs and the nursing notes.  Pertinent labs & imaging results that were available during my care of the  patient were reviewed by me and considered in my medical decision making (see chart for details).  Clinical Course as of Oct 17 2051  Sat Oct 17, 2019  2002 Patient seen by myself as well as PA provider.  Briefly is 43 year old female with no significant past medical history presents with left-sided breast abscess approximately 3 to 4 days.  She has never had an abscess before.  She denies any known trauma to the area.  She denies any known history of MRSA.  On exam the patient appears somewhat uncomfortable.  She is afebrile.  She has a very large fluctuant mass on the underside of her left breast at approximately 6:00.  Bedside ultrasound shows a fairly large pocket measuring approximately 2 cm of depth and perhaps 4 cm in width.  Give the patient a Percocet and attempted bedside I&D.  She does not have a primary care physician.  I will start her on antibiotics and advised that if she cannot see a doctor within a week she can return to the ED.  She will likely need packing given the size of the abscess.  I explained her the risks of the I&D including poor cosmesis, poor wound healing, and recurrent abscess, and she understands these risks and still wants to proceed.   [MT]    Clinical Course User Index [MT] Terald Sleeper, MD   MDM Rules/Calculators/A&P                      Patient with left breast abscess that is large, but superficial; large area of fluctuance with visible pustule.  Single straight incision as above.  Packing placed.  Will treat with clindamycin, probiotic, ibuprofen.  Wound care discussed.  Return in 5 to 7 days for wound recheck and packing  removal.  Return sooner if signs of spreading or systemic infection.  Patient understands and agrees with plan.  Patient vitals stable throughout ED course and discharged in satisfactory condition.  Patient also evaluated by my attending, Dr. Renaye Rakers, who guided the patient's management and agrees with plan.  Final Clinical Impression(s) / ED Diagnoses Final diagnoses:  Left breast abscess    Rx / DC Orders ED Discharge Orders         Ordered    clindamycin (CLEOCIN) 150 MG capsule  3 times daily     10/17/19 2048    ibuprofen (ADVIL) 600 MG tablet  Every 6 hours PRN     10/17/19 2048    Saccharomyces boulardii (PROBIOTIC) 250 MG CAPS  Daily     10/17/19 2048           Emi Holes, PA-C 10/17/19 2052    Terald Sleeper, MD 10/18/19 0000

## 2019-10-22 ENCOUNTER — Other Ambulatory Visit: Payer: Self-pay

## 2019-10-22 ENCOUNTER — Emergency Department (HOSPITAL_BASED_OUTPATIENT_CLINIC_OR_DEPARTMENT_OTHER)
Admission: EM | Admit: 2019-10-22 | Discharge: 2019-10-22 | Disposition: A | Discharge status: Home / Self Care | Payer: BC Managed Care – PPO | Attending: Emergency Medicine | Admitting: Emergency Medicine

## 2019-10-22 ENCOUNTER — Encounter (HOSPITAL_BASED_OUTPATIENT_CLINIC_OR_DEPARTMENT_OTHER): Payer: Self-pay | Admitting: Emergency Medicine

## 2019-10-22 DIAGNOSIS — F17210 Nicotine dependence, cigarettes, uncomplicated: Secondary | ICD-10-CM | POA: Insufficient documentation

## 2019-10-22 DIAGNOSIS — Z4800 Encounter for change or removal of nonsurgical wound dressing: Secondary | ICD-10-CM | POA: Diagnosis not present

## 2019-10-22 DIAGNOSIS — Z5189 Encounter for other specified aftercare: Secondary | ICD-10-CM

## 2019-10-22 DIAGNOSIS — N611 Abscess of the breast and nipple: Secondary | ICD-10-CM | POA: Insufficient documentation

## 2019-10-22 NOTE — ED Provider Notes (Signed)
[place] EMERGENCY DEPARTMENT Provider Note   CSN: [id number] Arrival date & time: 10/22/19  1552     History Chief Complaint  Patient presents with  . Wound Check    Nancy Hull is a 43 y.o. female.  HPI   43 year old female presenting for wound evaluation.  She is in the emergency room on 12/19 for an abscess of her left breast.  It was incised and drained.  Wound was packed.  She was started on antibiotics advised to come back in a few days for recheck.  She reports that she is still having some soreness but that is overall feeling better.  She reports compliance with her medications.  No fevers or chills.  Minimal drainage.  History reviewed. No pertinent past medical history.  There are no problems to display for this patient.   Past Surgical History:  Procedure Laterality Date  . CESAREAN SECTION       OB History   No obstetric history on file.     No family history on file.  Social History   Tobacco Use  . Smoking status: Current Every Day Smoker    Packs/day: 0.50    Types: Cigarettes  . Smokeless tobacco: Never Used  Substance Use Topics  . Alcohol use: Not Currently  . Drug use: No    Home Medications Prior to Admission medications   Medication Sig Start Date End Date Taking? Authorizing Provider  acetaminophen (TYLENOL) 500 MG tablet Take 2 tablets (1,000 mg total) by mouth every 6 (six) hours as needed. 10/09/18   Maczis, Barth Kirks, PA-C  benzonatate (TESSALON) 100 MG capsule Take 1 capsule (100 mg total) by mouth every 8 (eight) hours. 10/09/18   Maczis, Barth Kirks, PA-C  clindamycin (CLEOCIN) 150 MG capsule Take 3 capsules (450 mg total) by mouth 3 (three) times daily. 10/17/19   Law, Bea Graff, PA-C  ibuprofen (ADVIL) 600 MG tablet Take 1 tablet (600 mg total) by mouth every 6 (six) hours as needed. 10/17/19   Law, Bea Graff, PA-C  lidocaine (XYLOCAINE) 2 % solution Use as directed 15 mLs in the mouth or throat as  needed for mouth pain. Patient not taking: Reported on 10/06/2018 07/05/16   Lorayne Bender, PA-C  Saccharomyces boulardii (PROBIOTIC) 250 MG CAPS Take 250 mg by mouth daily. 10/17/19   Frederica Kuster, PA-C    Allergies    Patient has no known allergies.  Review of Systems   Review of Systems All systems reviewed and negative, other than as noted in HPI.  Physical Exam Updated Vital Signs BP 123/69 (BP Location: Left Arm)   Pulse 72   Temp 98.7 F (37.1 C) (Oral)   Resp 16   Ht 5\' 2"  (1.575 m)   Wt 73.5 kg   SpO2 100%   BMI 29.63 kg/m   Physical Exam Vitals and nursing note reviewed.  Constitutional:      General: She is not in acute distress.    Appearance: She is well-developed.  HENT:     Head: Normocephalic and atraumatic.  Eyes:     General:        Right eye: No discharge.        Left eye: No discharge.     Conjunctiva/sclera: Conjunctivae normal.  Cardiovascular:     Rate and Rhythm: Normal rate and regular rhythm.     Heart sounds: Normal heart sounds. No murmur. No friction rub. No gallop.   Pulmonary:  Effort: Pulmonary effort is normal. No respiratory distress.     Breath sounds: Normal breath sounds.  Abdominal:     General: There is no distension.     Palpations: Abdomen is soft.     Tenderness: There is no abdominal tenderness.  Musculoskeletal:     Cervical back: Neck supple.     Comments: Site of recent incision and drainage to the underside of the left breast is packed.  This was removed.  Overall appears to be healing well.  No significant drainage noted.  No surrounding cellulitis.  The surrounding breast tissue is soft.    Skin:    General: Skin is warm and dry.  Neurological:     Mental Status: She is alert.  Psychiatric:        Behavior: Behavior normal.        Thought Content: Thought content normal.     ED Results / Procedures / Treatments   Labs (all labs ordered are listed, but only abnormal results are displayed) Labs Reviewed -  No data to display  EKG None  Radiology No results found.  Procedures Procedures (including critical care time)  Medications Ordered in ED Medications - No data to display  ED Course  I have reviewed the triage vital signs and the nursing notes.  Pertinent labs & imaging results that were available during my care of the patient were reviewed by me and considered in my medical decision making (see chart for details).    MDM Rules/Calculators/A&P                     43 year old female presenting for wound check.  Status post recent incision and drainage of an abscess of her left breast.  This appears to be healing well.  Continued wound care was discussed as well as return precautions.    Final Clinical Impression(s) / ED Diagnoses Final diagnoses:  Visit for wound check    Rx / DC Orders ED Discharge Orders    None       Raeford Razor, MD 10/22/19 1645

## 2019-10-22 NOTE — ED Notes (Signed)
Patient verbalizes understanding of discharge instructions. Opportunity for questioning and answers were provided. Armband removed by staff, pt discharged from ED.  

## 2019-10-22 NOTE — ED Notes (Signed)
Patient is A&Ox4 at this time.  Patient in no signs of distress.  Please see providers note for complete history and physical exam.  

## 2019-10-22 NOTE — ED Triage Notes (Signed)
Pt here to have packing removed. She had an abscess on her L breast drained.

## 2019-10-31 LAB — AEROBIC CULTURE W GRAM STAIN (SUPERFICIAL SPECIMEN): Special Requests: NORMAL

## 2019-11-03 ENCOUNTER — Telehealth: Payer: Self-pay | Admitting: *Deleted

## 2019-11-03 NOTE — Telephone Encounter (Signed)
Post ED Visit - Positive Culture Follow-up  Culture report reviewed by antimicrobial stewardship pharmacist: [facility] []  , Pharm.D. []  Enzo Bi, Pharm.D., BCPS AQ-ID []  , Pharm.D., BCPS []  Celedonio Miyamoto, Pharm.D., BCPS []  [place], Garvin Fila.D., BCPS, AAHIVP []  , Pharm.D., BCPS, AAHIVP []  Georgina Pillion, PharmD, BCPS []  , PharmD, BCPS []  [place], PharmD, BCPS []  [address], PharmD []  , PharmD, BCPS []  Estella Husk, PharmD  Pharmacy Team []  Lysle Pearl, PharmD []  , PharmD []  Phillips Climes, PharmD []  , Rph []  Agapito Games) , PharmD []  Verlan Friends, PharmD []  , PharmD []  Mervyn Gay, PharmD []  , PharmD []  Vinnie Level, PharmD []  [facility], PharmD []  , PharmD []  Len Childs, PharmD   Positive wound culture Treated with Clindamycin HCL, organism sensitive to the same and no further patient follow-up is required at this time.  [facility] 11/03/2019, 10:21 AM

## 2021-08-16 ENCOUNTER — Other Ambulatory Visit: Payer: Self-pay

## 2021-08-16 ENCOUNTER — Emergency Department: Payer: BC Managed Care – PPO

## 2021-08-16 ENCOUNTER — Emergency Department
Admission: EM | Admit: 2021-08-16 | Discharge: 2021-08-16 | Disposition: A | Discharge status: Home / Self Care | Payer: BC Managed Care – PPO | Attending: Emergency Medicine | Admitting: Emergency Medicine

## 2021-08-16 ENCOUNTER — Encounter: Payer: Self-pay | Admitting: Emergency Medicine

## 2021-08-16 DIAGNOSIS — N200 Calculus of kidney: Secondary | ICD-10-CM | POA: Insufficient documentation

## 2021-08-16 DIAGNOSIS — F17210 Nicotine dependence, cigarettes, uncomplicated: Secondary | ICD-10-CM | POA: Diagnosis not present

## 2021-08-16 DIAGNOSIS — R1031 Right lower quadrant pain: Secondary | ICD-10-CM

## 2021-08-16 LAB — COMPREHENSIVE METABOLIC PANEL
ALT: 14 U/L (ref 0–44)
AST: 19 U/L (ref 15–41)
Albumin: 4 g/dL (ref 3.5–5.0)
Alkaline Phosphatase: 45 U/L (ref 38–126)
Anion gap: 7 (ref 5–15)
BUN: 10 mg/dL (ref 6–20)
CO2: 23 mmol/L (ref 22–32)
Calcium: 8.5 mg/dL — ABNORMAL LOW (ref 8.9–10.3)
Chloride: 105 mmol/L (ref 98–111)
Creatinine, Ser: 0.99 mg/dL (ref 0.44–1.00)
GFR, Estimated: >60 mL/min (ref >60)
Glucose, Bld: 160 mg/dL — ABNORMAL HIGH (ref 70–99)
Potassium: 3.4 mmol/L — ABNORMAL LOW (ref 3.5–5.1)
Sodium: 135 mmol/L (ref 135–145)
Total Bilirubin: 0.5 mg/dL (ref 0.3–1.2)
Total Protein: 7.8 g/dL (ref 6.5–8.1)

## 2021-08-16 LAB — PREGNANCY, URINE: Preg Test, Ur: NEGATIVE

## 2021-08-16 LAB — CBC
HCT: 30.3 % — ABNORMAL LOW (ref 36.0–46.0)
Hemoglobin: 9.2 g/dL — ABNORMAL LOW (ref 12.0–15.0)
MCH: 23.8 pg — ABNORMAL LOW (ref 26.0–34.0)
MCHC: 30.4 g/dL (ref 30.0–36.0)
MCV: 78.5 fL — ABNORMAL LOW (ref 80.0–100.0)
Platelets: 265 10*3/uL (ref 150–400)
RBC: 3.86 MIL/uL — ABNORMAL LOW (ref 3.87–5.11)
RDW: 16.1 % — ABNORMAL HIGH (ref 11.5–15.5)
WBC: 7.8 10*3/uL (ref 4.0–10.5)
nRBC: 0 % (ref 0.0–0.2)

## 2021-08-16 LAB — URINALYSIS, COMPLETE (UACMP) WITH MICROSCOPIC
Bilirubin Urine: NEGATIVE
Glucose, UA: NEGATIVE mg/dL
Ketones, ur: 20 mg/dL — AB
Leukocytes,Ua: NEGATIVE
Nitrite: NEGATIVE
Protein, ur: 100 mg/dL — AB
RBC / HPF: >50 RBC/hpf — ABNORMAL HIGH (ref 0–5)
Specific Gravity, Urine: 1.027 (ref 1.005–1.030)
Squamous Epithelial / HPF: NONE SEEN (ref 0–5)
WBC, UA: NONE SEEN WBC/hpf (ref 0–5)
pH: 5 (ref 5.0–8.0)

## 2021-08-16 LAB — LIPASE, BLOOD: Lipase: 28 U/L (ref 11–51)

## 2021-08-16 MED ORDER — FENTANYL CITRATE PF 50 MCG/ML IJ SOSY: 50.0000 ug | PREFILLED_SYRINGE | INTRAMUSCULAR | Status: DC | PRN

## 2021-08-16 MED ORDER — TAMSULOSIN HCL 0.4 MG PO CAPS
0.4000 mg | ORAL_CAPSULE | Freq: Once | ORAL | Status: AC
  Administered 2021-08-16: 0.4 mg via ORAL
  Filled 2021-08-16: qty 1

## 2021-08-16 MED ORDER — HYDROCODONE-ACETAMINOPHEN 5-325 MG PO TABS: 1.0000 | ORAL_TABLET | ORAL | 0 refills | Status: AC | PRN

## 2021-08-16 MED ORDER — HYDROCODONE-ACETAMINOPHEN 5-325 MG PO TABS
2.0000 | ORAL_TABLET | Freq: Once | ORAL | Status: AC
  Administered 2021-08-16: 2 via ORAL
  Filled 2021-08-16: qty 2

## 2021-08-16 MED ORDER — ONDANSETRON 4 MG PO TBDP
4.0000 mg | ORAL_TABLET | Freq: Once | ORAL | Status: AC | PRN
  Administered 2021-08-16: 4 mg via ORAL
  Filled 2021-08-16: qty 1

## 2021-08-16 MED ORDER — ONDANSETRON 4 MG PO TBDP: 4.0000 mg | ORAL_TABLET | Freq: Three times a day (TID) | ORAL | 0 refills | Status: AC | PRN

## 2021-08-16 MED ORDER — TAMSULOSIN HCL 0.4 MG PO CAPS: 0.4000 mg | ORAL_CAPSULE | Freq: Every day | ORAL | 0 refills | Status: AC

## 2021-08-16 NOTE — ED Provider Notes (Signed)
[facility] Emergency Department Provider Note   ____________________________________________   Event Date/Time   First MD Initiated Contact with Patient 08/16/21 [phone]     (approximate)  I have reviewed the triage vital signs and the nursing notes.   HISTORY  Chief Complaint No chief complaint on file.    HPI Stephine Deshauna Cayson is a 45 y.o. female who presents for right lower quadrant abdominal pain  LOCATION: Right lower quadrant DURATION: 1 week prior to arrival TIMING: Worsening over the last 24 hours SEVERITY: 10/10 QUALITY: Aching/burning pain CONTEXT: Patient states that she originally started feeling some right flank pain proxy 1 week ago that eventually developed into right lower quadrant pain that has been acutely worsening over the last 24 hours with associated nausea/vomiting MODIFYING FACTORS: Denies any exacerbating or relieving factors ASSOCIATED SYMPTOMS: Nausea/vomiting   Per medical record review, patient has history of C-section          History reviewed. No pertinent past medical history.  There are no problems to display for this patient.   Past Surgical History:  Procedure Laterality Date   CESAREAN SECTION      Prior to Admission medications   Medication Sig Start Date End Date Taking? Authorizing Provider  HYDROcodone-acetaminophen (NORCO) 5-325 MG tablet Take 1 tablet by mouth every 4 (four) hours as needed for up to 3 days for moderate pain or severe pain. 08/16/21 08/19/21 Yes Dacota Devall, Clent Jacks, MD  ondansetron (ZOFRAN ODT) 4 MG disintegrating tablet Take 1 tablet (4 mg total) by mouth every 8 (eight) hours as needed for nausea or vomiting. 08/16/21  Yes Merwyn Katos, MD  tamsulosin (FLOMAX) 0.4 MG CAPS capsule Take 1 capsule (0.4 mg total) by mouth daily for 7 days. 08/16/21 08/23/21 Yes Merwyn Katos, MD  acetaminophen (TYLENOL) 500 MG tablet Take 2 tablets (1,000 mg total) by mouth every 6 (six) hours as  needed. 10/09/18   Maczis, Elmer Sow, PA-C  benzonatate (TESSALON) 100 MG capsule Take 1 capsule (100 mg total) by mouth every 8 (eight) hours. 10/09/18   Maczis, Elmer Sow, PA-C  clindamycin (CLEOCIN) 150 MG capsule Take 3 capsules (450 mg total) by mouth 3 (three) times daily. 10/17/19   Law, Waylan Boga, PA-C  ibuprofen (ADVIL) 600 MG tablet Take 1 tablet (600 mg total) by mouth every 6 (six) hours as needed. 10/17/19   Law, Waylan Boga, PA-C  lidocaine (XYLOCAINE) 2 % solution Use as directed 15 mLs in the mouth or throat as needed for mouth pain. Patient not taking: Reported on 10/06/2018 07/05/16   Anselm Pancoast, PA-C  Saccharomyces boulardii (PROBIOTIC) 250 MG CAPS Take 250 mg by mouth daily. 10/17/19   Emi Holes, PA-C    Allergies Patient has no known allergies.  History reviewed. No pertinent family history.  Social History Social History   Tobacco Use   Smoking status: Every Day    Packs/day: 0.50    Types: Cigarettes   Smokeless tobacco: Never  Vaping Use   Vaping Use: Never used  Substance Use Topics   Alcohol use: Not Currently   Drug use: No    Review of Systems Constitutional: No fever/chills Eyes: No visual changes. ENT: No sore throat. Cardiovascular: Denies chest pain. Respiratory: Denies shortness of breath. Gastrointestinal: Endorses right lower quadrant abdominal pain.  Endorses nausea and vomiting.  No diarrhea. Genitourinary: Negative for dysuria. Musculoskeletal: Negative for acute arthralgias Skin: Negative for rash. Neurological: Negative for headaches, weakness/numbness/paresthesias in any extremity  Psychiatric: Negative for suicidal ideation/homicidal ideation   ____________________________________________   PHYSICAL EXAM:  VITAL SIGNS: ED Triage Vitals  Enc Vitals Group     BP 08/16/21 0431 114/71     Pulse Rate 08/16/21 0431 63     Resp 08/16/21 0431 17     Temp 08/16/21 0431 97.8 F (36.6 C)     Temp Source 08/16/21 0431 Oral      SpO2 08/16/21 0431 100 %     Weight 08/16/21 0834 162 lb 0.6 oz (73.5 kg)     Height 08/16/21 0834 5\' 2"  (1.575 m)     Head Circumference --      Peak Flow --      Pain Score 08/16/21 0833 3     Pain Loc --      Pain Edu? --      Excl. in GC? --    Constitutional: Alert and oriented. Well appearing and in no acute distress. Eyes: Conjunctivae are normal. PERRL. Head: Atraumatic. Nose: No congestion/rhinnorhea. Mouth/Throat: Mucous membranes are moist. Neck: No stridor Cardiovascular: Grossly normal heart sounds.  Good peripheral circulation. Respiratory: Normal respiratory effort.  No retractions. Gastrointestinal: Right CVA tenderness to percussion.  Soft and nontender. No distention. Musculoskeletal: No obvious deformities Neurologic:  Normal speech and language. No gross focal neurologic deficits are appreciated. Skin:  Skin is warm and dry. No rash noted. Psychiatric: Mood and affect are normal. Speech and behavior are normal.  ____________________________________________   LABS (all labs ordered are listed, but only abnormal results are displayed)  Labs Reviewed  COMPREHENSIVE METABOLIC PANEL - Abnormal; Notable for the following components:      Result Value   Potassium 3.4 (*)    Glucose, Bld 160 (*)    Calcium 8.5 (*)    All other components within normal limits  CBC - Abnormal; Notable for the following components:   RBC 3.86 (*)    Hemoglobin 9.2 (*)    HCT 30.3 (*)    MCV 78.5 (*)    MCH 23.8 (*)    RDW 16.1 (*)    All other components within normal limits  URINALYSIS, COMPLETE (UACMP) WITH MICROSCOPIC - Abnormal; Notable for the following components:   Color, Urine AMBER (*)    APPearance CLOUDY (*)    Hgb urine dipstick LARGE (*)    Ketones, ur 20 (*)    Protein, ur 100 (*)    RBC / HPF >50 (*)    Bacteria, UA FEW (*)    All other components within normal limits  LIPASE, BLOOD  PREGNANCY, URINE    ____________________________________________  RADIOLOGY  ED MD interpretation: CT Noncon of the abdomen and pelvis shows a 2 mm calculus at the right ureteral orifice with mild hydronephrosis  Official radiology report(s): CT Renal Stone Study  Result Date: 08/16/2021 CLINICAL DATA:  Flank pain EXAM: CT ABDOMEN AND PELVIS WITHOUT CONTRAST TECHNIQUE: Multidetector CT imaging of the abdomen and pelvis was performed following the standard protocol without IV contrast. COMPARISON:  None. FINDINGS: Lower chest: No pleural or pericardial effusion. Blood pool is hypodense compared to the interventricular septum suggesting anemia. Hepatobiliary: No focal liver abnormality is seen. No gallstones, gallbladder wall thickening, or biliary dilatation. Pancreas: Unremarkable. No pancreatic ductal dilatation or surrounding inflammatory changes. Spleen: Normal in size without focal abnormality. Adrenals/Urinary Tract: Adrenal glands unremarkable. Bilateral 1-2 mm renal calculi. The right renal parenchyma appears vaguely hypodense compared to the left, with surrounding inflammatory/edematous change. There is mild right hydronephrosis and  ureterectasis. 2 mm calculus at the right ureteral orifice. The urinary bladder is nondistended. Stomach/Bowel: Stomach is incompletely distended, unremarkable. The small bowel is decompressed. Normal appendix. The colon is nondilated, unremarkable. Vascular/Lymphatic: No significant vascular findings are present. No enlarged abdominal or pelvic lymph nodes. Reproductive: Enlarged globular uterus suggesting fibroids. No adnexal mass. Other: No ascites.  No free air. Musculoskeletal: No acute or significant osseous findings. IMPRESSION: 1. 2 mm calculus at the right ureteral orifice with mild hydronephrosis and ureterectasis. 2. Bilateral nephrolithiasis Electronically Signed   By: Corlis Leak M.D.   On: 08/16/2021 08:49     ____________________________________________   PROCEDURES  Procedure(s) performed (including Critical Care):  .1-3 Lead EKG Interpretation Performed by: Merwyn Katos, MD Authorized by: Merwyn Katos, MD     Interpretation: normal     ECG rate:  62   ECG rate assessment: normal     Rhythm: sinus rhythm     Ectopy: none     Conduction: normal     ____________________________________________   INITIAL IMPRESSION / ASSESSMENT AND PLAN / ED COURSE  As part of my medical decision making, I reviewed the following data within the electronic medical record, if available:  Nursing notes reviewed and incorporated, Labs reviewed, EKG interpreted, Old chart reviewed, Radiograph reviewed and Notes from prior ED visits reviewed and incorporated        Patient presents for severe flank pain. Presentation most consistent with Renal Colic from a Non-infected Kidney Stone. Given History and Exam I have lower suspicion for atypical appendicitis, genital torsion, acute cholecystitis, AAA, Aortic Dissection, Serious Bacterial Illness or other emergent intraabdominal pathology.  Workup: CBC, BMP, CT Abd/Pelvis noncontrast, UA, reassess Findings: 2 mm right ureterolithiasis at the UVJ Reassesment: Patient tolerating PO and pain controlled Disposition:  Discharge. Strict return precautions for infected stone or PO intolerance discussed.      ____________________________________________   FINAL CLINICAL IMPRESSION(S) / ED DIAGNOSES  Final diagnoses:  Kidney stone on right side  Right lower quadrant abdominal pain     ED Discharge Orders          Ordered    HYDROcodone-acetaminophen (NORCO) 5-325 MG tablet  Every 4 hours PRN        08/16/21 0931    tamsulosin (FLOMAX) 0.4 MG CAPS capsule  Daily        08/16/21 0932    ondansetron (ZOFRAN ODT) 4 MG disintegrating tablet  Every 8 hours PRN        08/16/21 0932             Note:  This document was prepared using  Dragon voice recognition software and may include unintentional dictation errors.    Merwyn Katos, MD 08/16/21 [phone]

## 2021-08-16 NOTE — ED Notes (Signed)
Lab called and informed of Urine pregnancy add-on.

## 2021-08-16 NOTE — ED Triage Notes (Signed)
Pt c/o LRQ abdominal pain x1 week, worsening over last 24 hours as well as new onset of N/V. Pt denies urinary symptoms as well as fever.

## 2022-06-19 ENCOUNTER — Encounter (HOSPITAL_BASED_OUTPATIENT_CLINIC_OR_DEPARTMENT_OTHER): Payer: Self-pay | Admitting: Emergency Medicine

## 2022-06-19 ENCOUNTER — Other Ambulatory Visit: Payer: Self-pay

## 2022-06-19 ENCOUNTER — Emergency Department (HOSPITAL_BASED_OUTPATIENT_CLINIC_OR_DEPARTMENT_OTHER)
Admission: EM | Admit: 2022-06-19 | Discharge: 2022-06-19 | Disposition: A | Discharge status: Home / Self Care | Payer: 59 | Attending: Emergency Medicine | Admitting: Emergency Medicine

## 2022-06-19 ENCOUNTER — Emergency Department (HOSPITAL_BASED_OUTPATIENT_CLINIC_OR_DEPARTMENT_OTHER): Payer: 59

## 2022-06-19 DIAGNOSIS — R1031 Right lower quadrant pain: Secondary | ICD-10-CM | POA: Insufficient documentation

## 2022-06-19 DIAGNOSIS — R112 Nausea with vomiting, unspecified: Secondary | ICD-10-CM | POA: Diagnosis not present

## 2022-06-19 DIAGNOSIS — D649 Anemia, unspecified: Secondary | ICD-10-CM | POA: Insufficient documentation

## 2022-06-19 LAB — URINALYSIS, ROUTINE W REFLEX MICROSCOPIC
Bilirubin Urine: NEGATIVE
Glucose, UA: NEGATIVE mg/dL
Hgb urine dipstick: NEGATIVE
Ketones, ur: NEGATIVE mg/dL
Leukocytes,Ua: NEGATIVE
Nitrite: NEGATIVE
Protein, ur: NEGATIVE mg/dL
Specific Gravity, Urine: 1.025 (ref 1.005–1.030)
pH: 6 (ref 5.0–8.0)

## 2022-06-19 LAB — CBC WITH DIFFERENTIAL/PLATELET
Abs Immature Granulocytes: 0.03 10*3/uL (ref 0.00–0.07)
Basophils Absolute: 0 10*3/uL (ref 0.0–0.1)
Basophils Relative: 0 %
Eosinophils Absolute: 0.1 10*3/uL (ref 0.0–0.5)
Eosinophils Relative: 1 %
HCT: 31.1 % — ABNORMAL LOW (ref 36.0–46.0)
Hemoglobin: 9.4 g/dL — ABNORMAL LOW (ref 12.0–15.0)
Immature Granulocytes: 0 %
Lymphocytes Relative: 31 %
Lymphs Abs: 3.3 10*3/uL (ref 0.7–4.0)
MCH: 23 pg — ABNORMAL LOW (ref 26.0–34.0)
MCHC: 30.2 g/dL (ref 30.0–36.0)
MCV: 76 fL — ABNORMAL LOW (ref 80.0–100.0)
Monocytes Absolute: 0.6 10*3/uL (ref 0.1–1.0)
Monocytes Relative: 6 %
Neutro Abs: 6.5 10*3/uL (ref 1.7–7.7)
Neutrophils Relative %: 62 %
Platelets: 281 10*3/uL (ref 150–400)
RBC: 4.09 MIL/uL (ref 3.87–5.11)
RDW: 16.9 % — ABNORMAL HIGH (ref 11.5–15.5)
WBC: 10.6 10*3/uL — ABNORMAL HIGH (ref 4.0–10.5)
nRBC: 0 % (ref 0.0–0.2)

## 2022-06-19 LAB — COMPREHENSIVE METABOLIC PANEL
ALT: 18 U/L (ref 0–44)
AST: 20 U/L (ref 15–41)
Albumin: 3.5 g/dL (ref 3.5–5.0)
Alkaline Phosphatase: 51 U/L (ref 38–126)
Anion gap: 7 (ref 5–15)
BUN: 14 mg/dL (ref 6–20)
CO2: 21 mmol/L — ABNORMAL LOW (ref 22–32)
Calcium: 8.2 mg/dL — ABNORMAL LOW (ref 8.9–10.3)
Chloride: 110 mmol/L (ref 98–111)
Creatinine, Ser: 1.2 mg/dL — ABNORMAL HIGH (ref 0.44–1.00)
GFR, Estimated: 58 mL/min — ABNORMAL LOW (ref >60)
Glucose, Bld: 100 mg/dL — ABNORMAL HIGH (ref 70–99)
Potassium: 4 mmol/L (ref 3.5–5.1)
Sodium: 138 mmol/L (ref 135–145)
Total Bilirubin: 0.4 mg/dL (ref 0.3–1.2)
Total Protein: 7.6 g/dL (ref 6.5–8.1)

## 2022-06-19 LAB — PREGNANCY, URINE: Preg Test, Ur: NEGATIVE

## 2022-06-19 LAB — LIPASE, BLOOD: Lipase: 44 U/L (ref 11–51)

## 2022-06-19 MED ORDER — OXYCODONE-ACETAMINOPHEN 5-325 MG PO TABS
1.0000 | ORAL_TABLET | ORAL | Status: DC | PRN
  Administered 2022-06-19: 1 via ORAL
  Filled 2022-06-19: qty 1

## 2022-06-19 MED ORDER — ONDANSETRON 4 MG PO TBDP: 4.0000 mg | ORAL_TABLET | Freq: Three times a day (TID) | ORAL | 0 refills | Status: AC | PRN

## 2022-06-19 MED ORDER — FENTANYL CITRATE PF 50 MCG/ML IJ SOSY
50.0000 ug | PREFILLED_SYRINGE | Freq: Once | INTRAMUSCULAR | Status: AC
  Administered 2022-06-19: 50 ug via INTRAVENOUS
  Filled 2022-06-19: qty 1

## 2022-06-19 MED ORDER — KETOROLAC TROMETHAMINE 15 MG/ML IJ SOLN
15.0000 mg | Freq: Once | INTRAMUSCULAR | Status: AC
  Administered 2022-06-19: 15 mg via INTRAVENOUS
  Filled 2022-06-19: qty 1

## 2022-06-19 MED ORDER — SODIUM CHLORIDE 0.9 % IV BOLUS
1000.0000 mL | Freq: Once | INTRAVENOUS | Status: AC
  Administered 2022-06-19: 1000 mL via INTRAVENOUS

## 2022-06-19 MED ORDER — ONDANSETRON HCL 4 MG/2ML IJ SOLN
4.0000 mg | Freq: Once | INTRAMUSCULAR | Status: AC
Start: 2022-06-19 — End: 2022-06-19
  Administered 2022-06-19: 4 mg via INTRAVENOUS
  Filled 2022-06-19: qty 2

## 2022-06-19 MED ORDER — ONDANSETRON 4 MG PO TBDP
4.0000 mg | ORAL_TABLET | Freq: Once | ORAL | Status: AC
  Administered 2022-06-19: 4 mg via ORAL
  Filled 2022-06-19: qty 1

## 2022-06-19 MED ORDER — IOHEXOL 300 MG/ML  SOLN
100.0000 mL | Freq: Once | INTRAMUSCULAR | Status: AC | PRN
  Administered 2022-06-19: 100 mL via INTRAVENOUS

## 2022-06-19 NOTE — Discharge Instructions (Addendum)
Drink plenty of fluids.  Please follow up with your family doctor for recheck in the next week.

## 2022-06-19 NOTE — ED Triage Notes (Signed)
Constant RLQ abdominal pain x 1 week. Intermittent nausea. Denies vomiting, fevers, diarrhea.

## 2022-06-19 NOTE — ED Provider Notes (Signed)
[facility] [facility] EMERGENCY DEPARTMENT Provider Note   CSN: [id number] Arrival date & time: 06/19/22  0002     History  Chief Complaint  Patient presents with   Abdominal Pain    Nancy Hull is a 46 y.o. female.  The history is provided by the patient and medical records.  Abdominal Pain Nancy Hull is a 46 y.o. female who presents to the Emergency Department complaining of abdominal pain.  She presents to the emergency department for evaluation of 2 weeks of right lower sided abdominal pain.  Pain is worse with walking and moving.  No associated fever.  For the last few days she has had associated nausea and vomiting.  No diarrhea, dysuria, vaginal discharge.  No shortness of breath.  No lower extremity edema.  Her pain in her abdomen worsens when she takes a deep breath.    No medical problems.  Hx/o c/s.  No prior similar sxs.  No injuries.       Home Medications Prior to Admission medications   Medication Sig Start Date End Date Taking? Authorizing Provider  ondansetron (ZOFRAN-ODT) 4 MG disintegrating tablet Take 1 tablet (4 mg total) by mouth every 8 (eight) hours as needed for nausea or vomiting. 06/19/22  Yes Quintella Reichert, MD  acetaminophen (TYLENOL) 500 MG tablet Take 2 tablets (1,000 mg total) by mouth every 6 (six) hours as needed. 10/09/18   Maczis, Barth Kirks, PA-C  benzonatate (TESSALON) 100 MG capsule Take 1 capsule (100 mg total) by mouth every 8 (eight) hours. 10/09/18   Maczis, Barth Kirks, PA-C  clindamycin (CLEOCIN) 150 MG capsule Take 3 capsules (450 mg total) by mouth 3 (three) times daily. 10/17/19   Law, Bea Graff, PA-C  ibuprofen (ADVIL) 600 MG tablet Take 1 tablet (600 mg total) by mouth every 6 (six) hours as needed. 10/17/19   Law, Bea Graff, PA-C  lidocaine (XYLOCAINE) 2 % solution Use as directed 15 mLs in the mouth or throat as needed for mouth pain. Patient not taking: Reported on 10/06/2018 07/05/16   Joy, Shawn C, PA-C   ondansetron (ZOFRAN ODT) 4 MG disintegrating tablet Take 1 tablet (4 mg total) by mouth every 8 (eight) hours as needed for nausea or vomiting. 08/16/21   Naaman Plummer, MD  Saccharomyces boulardii (PROBIOTIC) 250 MG CAPS Take 250 mg by mouth daily. 10/17/19   Frederica Kuster, PA-C      Allergies    Patient has no known allergies.    Review of Systems   Review of Systems  Gastrointestinal:  Positive for abdominal pain.  All other systems reviewed and are negative.   Physical Exam Updated Vital Signs BP 112/68   Pulse 62   Temp 98 F (36.7 C) (Oral)   Resp 18   Ht 5\' 2"  (1.575 m)   Wt 63.5 kg   LMP 06/13/2022 (Exact Date)   SpO2 100%   BMI 25.61 kg/m  Physical Exam Vitals and nursing note reviewed.  Constitutional:      Appearance: She is well-developed.  HENT:     Head: Normocephalic and atraumatic.  Cardiovascular:     Rate and Rhythm: Normal rate and regular rhythm.     Heart sounds: No murmur heard. Pulmonary:     Effort: Pulmonary effort is normal. No respiratory distress.     Breath sounds: Normal breath sounds.  Abdominal:     Palpations: Abdomen is soft.     Tenderness: There is abdominal tenderness. There is no  guarding or rebound.     Comments: TTP over RLQ.  Neg murphys.   Musculoskeletal:        General: No swelling or tenderness.  Skin:    General: Skin is warm and dry.  Neurological:     Mental Status: She is alert and oriented to person, place, and time.  Psychiatric:        Behavior: Behavior normal.     ED Results / Procedures / Treatments   Labs (all labs ordered are listed, but only abnormal results are displayed) Labs Reviewed  COMPREHENSIVE METABOLIC PANEL - Abnormal; Notable for the following components:      Result Value   CO2 21 (*)    Glucose, Bld 100 (*)    Creatinine, Ser 1.20 (*)    Calcium 8.2 (*)    GFR, Estimated 58 (*)    All other components within normal limits  CBC WITH DIFFERENTIAL/PLATELET - Abnormal; Notable  for the following components:   WBC 10.6 (*)    Hemoglobin 9.4 (*)    HCT 31.1 (*)    MCV 76.0 (*)    MCH 23.0 (*)    RDW 16.9 (*)    All other components within normal limits  LIPASE, BLOOD  URINALYSIS, ROUTINE W REFLEX MICROSCOPIC  PREGNANCY, URINE    EKG None  Radiology CT Abdomen Pelvis W Contrast  Result Date: 06/19/2022 CLINICAL DATA:  Right lower quadrant abdominal pain, nausea EXAM: CT ABDOMEN AND PELVIS WITH CONTRAST TECHNIQUE: Multidetector CT imaging of the abdomen and pelvis was performed using the standard protocol following bolus administration of intravenous contrast. RADIATION DOSE REDUCTION: This exam was performed according to the departmental dose-optimization program which includes automated exposure control, adjustment of the mA and/or kV according to patient size and/or use of iterative reconstruction technique. CONTRAST:  OMNIPAQUE IOHEXOL 300 MG/ML  SOLN COMPARISON:  08/16/2021 FINDINGS: Lower chest: No acute abnormality. Hepatobiliary: No focal liver abnormality is seen. No gallstones, gallbladder wall thickening, or biliary dilatation. Pancreas: Unremarkable Spleen: Unremarkable Adrenals/Urinary Tract: The adrenal glands are unremarkable. The kidneys are normal in size and position. Punctate nonobstructing calculi are seen within the kidneys bilaterally, right greater than left, measuring up to 3 mm. No ureteral calculi. No hydronephrosis. No enhancing intrarenal masses. Bladder is unremarkable. Stomach/Bowel: Stomach is within normal limits. Appendix appears normal. No evidence of bowel wall thickening, distention, or inflammatory changes. Vascular/Lymphatic: No significant vascular findings are present. No enlarged abdominal or pelvic lymph nodes. Reproductive: Uterus and bilateral adnexa are unremarkable. Other: No abdominal wall hernia or abnormality. No abdominopelvic ascites. Musculoskeletal: No acute bone abnormality. No lytic or blastic bone lesion.  IMPRESSION: 1. No acute intra-abdominal pathology identified. 2. Minimal nonobstructing nephrolithiasis. No urolithiasis. No hydronephrosis. Electronically Signed   By: Helyn Numbers M.D.   On: 06/19/2022 03:15    Procedures Procedures    Medications Ordered in ED Medications  oxyCODONE-acetaminophen (PERCOCET/ROXICET) 5-325 MG per tablet 1 tablet (1 tablet Oral Given 06/19/22 0027)  ondansetron (ZOFRAN-ODT) disintegrating tablet 4 mg (4 mg Oral Given 06/19/22 0028)  sodium chloride 0.9 % bolus 1,000 mL (0 mLs Intravenous Stopped 06/19/22 0425)  fentaNYL (SUBLIMAZE) injection 50 mcg (50 mcg Intravenous Given 06/19/22 0255)  ondansetron (ZOFRAN) injection 4 mg (4 mg Intravenous Given 06/19/22 0253)  iohexol (OMNIPAQUE) 300 MG/ML solution 100 mL (100 mLs Intravenous Contrast Given 06/19/22 0300)  ketorolac (TORADOL) 15 MG/ML injection 15 mg (15 mg Intravenous Given 06/19/22 0325)    ED Course/ Medical Decision Making/ A&P  Medical Decision Making Amount and/or Complexity of Data Reviewed Labs: ordered. Radiology: ordered.  Risk Prescription drug management.   Patient here for evaluation of 2 weeks of progressive right lower quadrant abdominal pain with couple of days of nausea and vomiting.  She does have right lower quadrant tenderness on examination, negative Murphy's.  Labs significant for mild elevation in creatinine when compared to priors, CBC with stable anemia.  Given her symptoms a CT scan was obtained, which is negative for acute appendicitis.  Discussed with patient incidental finding of nephrolithiasis.  She was treated with fentanyl for pain as well as IV fluids.  She was also treated with ketorolac with significant improvement in her pain.  She is able to tolerate oral fluids on reassessment.  Discussed with patient findings of studies.  Suspect her pain is musculoskeletal in origin.  Discussed importance of close PCP follow-up for repeat evaluation given  her lab abnormalities as well as return precautions for progressive or new concerning symptoms       Final Clinical Impression(s) / ED Diagnoses Final diagnoses:  Right lower quadrant abdominal pain  Anemia, unspecified type    Rx / DC Orders ED Discharge Orders          Ordered    ondansetron (ZOFRAN-ODT) 4 MG disintegrating tablet  Every 8 hours PRN        06/19/22 0426              Tilden Fossa, MD 06/19/22 0501

## 2022-06-19 NOTE — ED Notes (Signed)
Patient transported to CT 

## 2022-07-31 IMAGING — CT CT RENAL STONE PROTOCOL
3 of 4 series · 7 of 46 positions shown, 13 images · non-contrast
Comparison: None.

CLINICAL DATA: Flank pain

EXAM:
CT ABDOMEN AND PELVIS WITHOUT CONTRAST
TECHNIQUE: Multidetector CT imaging of the abdomen and pelvis was performed
following the standard protocol without IV contrast.

[Series 4: lung bases · axial · 0.78mm/px · z∈[-490,-450]mm · 3 of 17 slices shown, 7 images]
[im 5/17  soft-tissue]
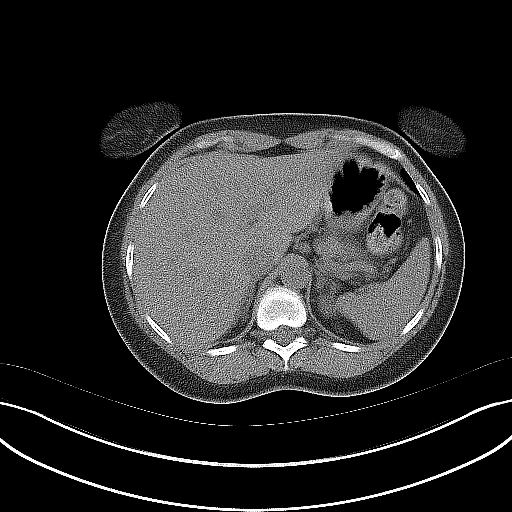
[im 5/17  lung]
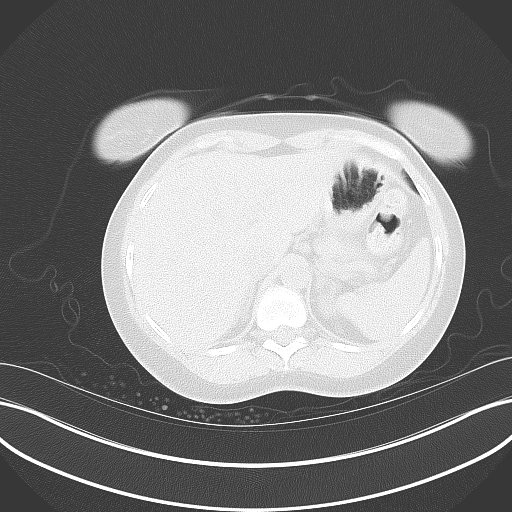
[im 5/17  bone]
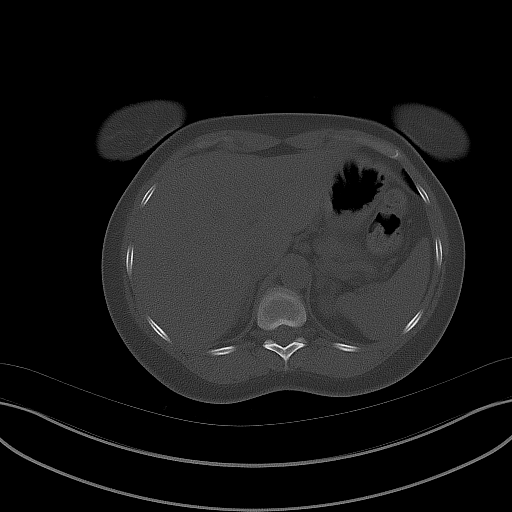
[im 9/17  soft-tissue]
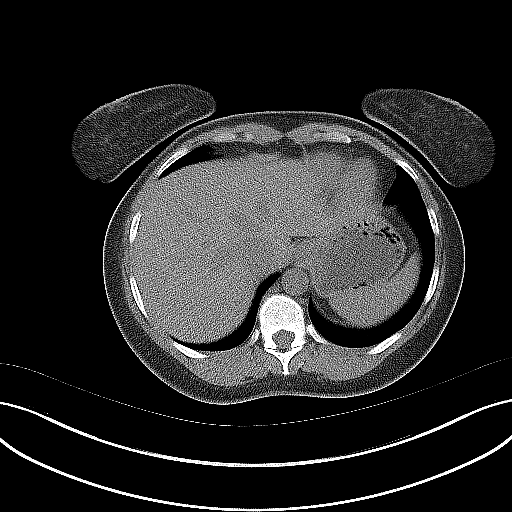
[im 9/17  lung]
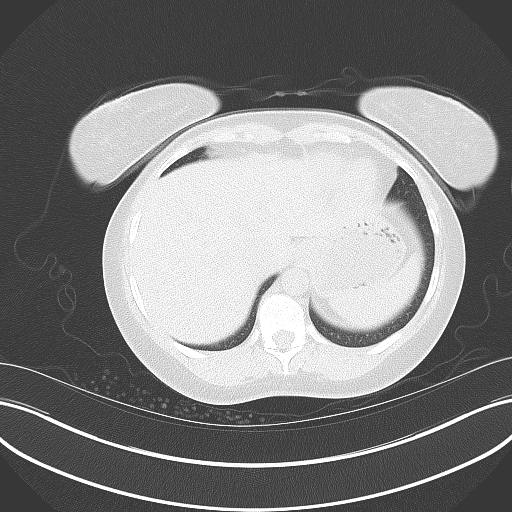
[im 13/17  soft-tissue]
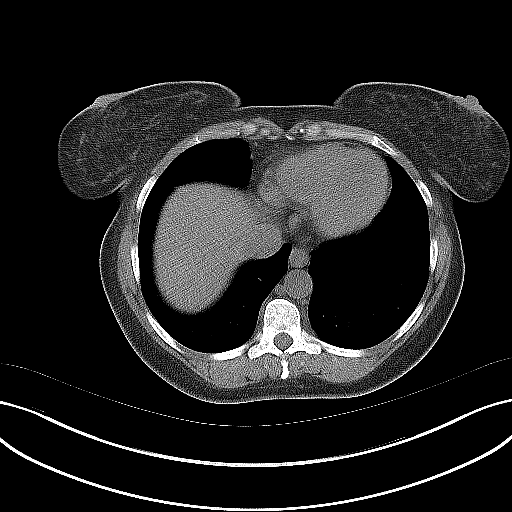
[im 13/17  lung]
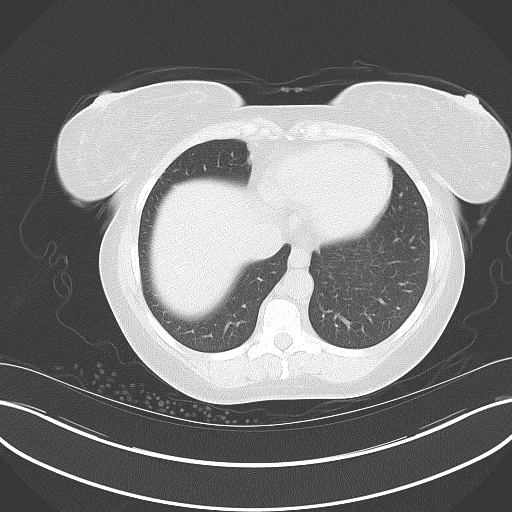

[Series 5: coronal · coronal · 0.85mm/px · 3 of 137 slices shown, 4 images]
[im 46/137  soft-tissue]
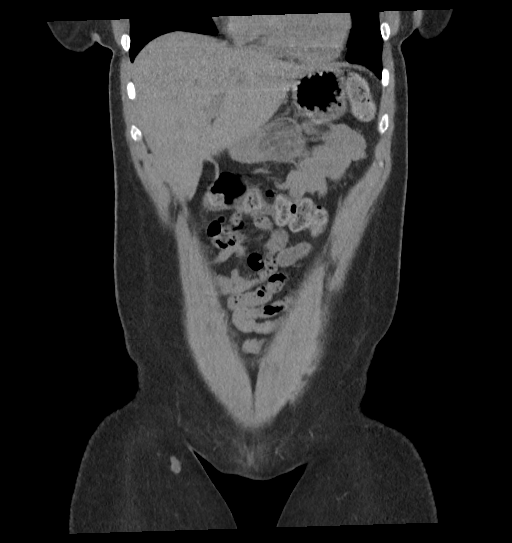
[im 61/137  soft-tissue]
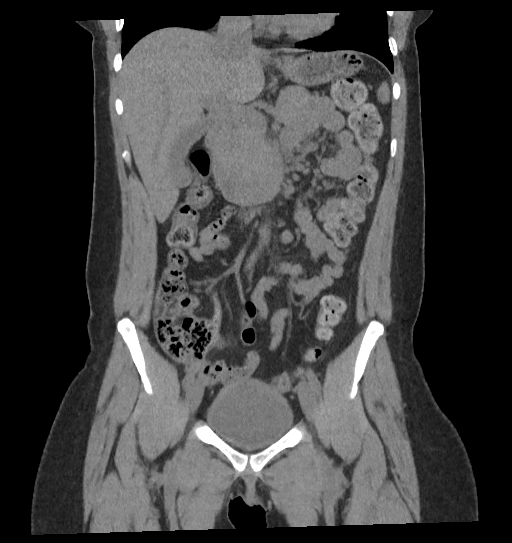
[im 61/137  bone]
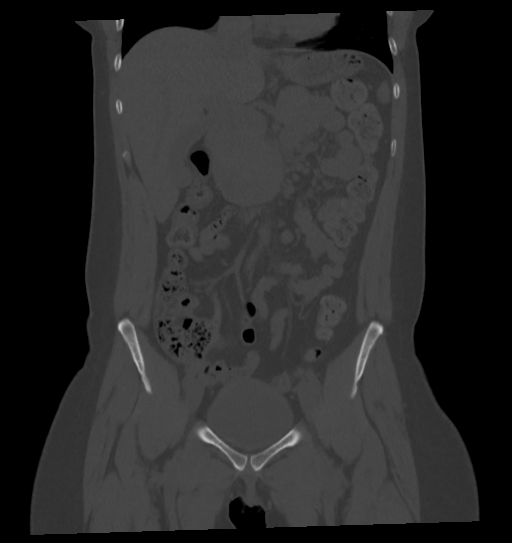
[im 76/137  soft-tissue]
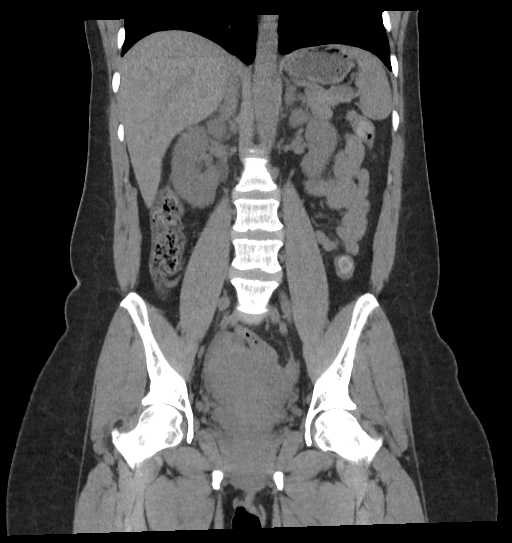

[Series 6: sagittal · sagittal · 0.53mm/px · 1 of 219 slices shown, 2 images]
[im 73/219  soft-tissue]
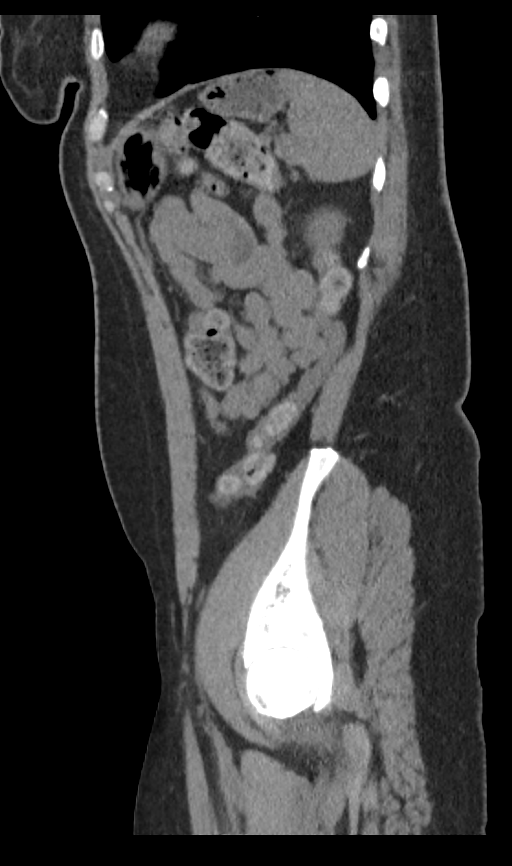
[im 73/219  bone]
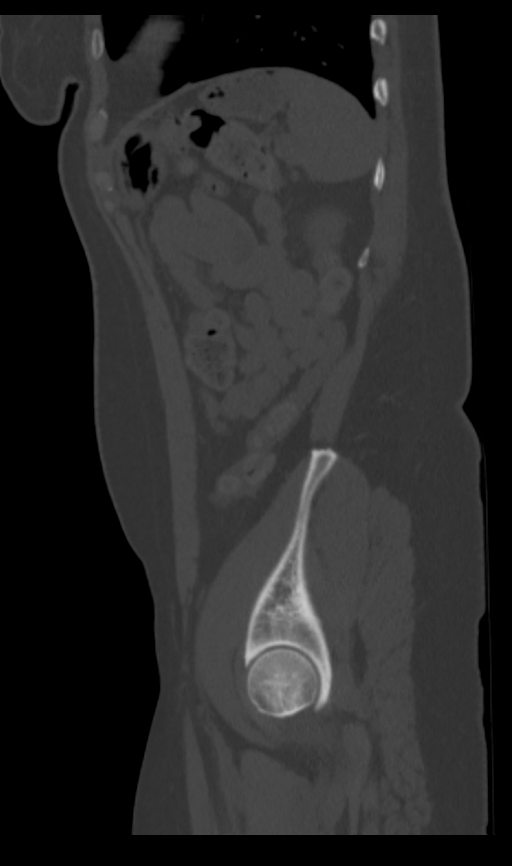

[7 of 46 positions shown; findings below may reference images not displayed]

FINDINGS: Lower chest: No pleural or pericardial effusion. Blood pool is
hypodense compared to the interventricular septum suggesting anemia.

Hepatobiliary: No focal liver abnormality is seen. No gallstones,
gallbladder wall thickening, or biliary dilatation.

Pancreas: Unremarkable. No pancreatic ductal dilatation or
surrounding inflammatory changes.

Spleen: Normal in size without focal abnormality.

Adrenals/Urinary Tract: Adrenal glands unremarkable. Bilateral 1-2
mm renal calculi. The right renal parenchyma appears vaguely
hypodense compared to the left, with surrounding
inflammatory/edematous change. There is mild right hydronephrosis
and ureterectasis. 2 mm calculus at the right ureteral orifice. The
urinary bladder is nondistended.

Stomach/Bowel: Stomach is incompletely distended, unremarkable. The
small bowel is decompressed. Normal appendix. The colon is
nondilated, unremarkable.

Vascular/Lymphatic: No significant vascular findings are present. No
enlarged abdominal or pelvic lymph nodes.

Reproductive: Enlarged globular uterus suggesting fibroids. No
adnexal mass.

Other: No ascites.  No free air.

Musculoskeletal: No acute or significant osseous findings.
IMPRESSION: 1. 2 mm calculus at the right ureteral orifice with mild
hydronephrosis and ureterectasis.
2. Bilateral nephrolithiasis

## 2023-07-27 ENCOUNTER — Other Ambulatory Visit: Payer: Self-pay

## 2023-07-27 ENCOUNTER — Encounter (HOSPITAL_BASED_OUTPATIENT_CLINIC_OR_DEPARTMENT_OTHER): Payer: Self-pay

## 2023-07-27 ENCOUNTER — Emergency Department (HOSPITAL_BASED_OUTPATIENT_CLINIC_OR_DEPARTMENT_OTHER)
Admission: EM | Admit: 2023-07-27 | Discharge: 2023-07-28 | Disposition: A | Discharge status: Home / Self Care | Payer: 59 | Attending: Emergency Medicine | Admitting: Emergency Medicine

## 2023-07-27 ENCOUNTER — Emergency Department (HOSPITAL_BASED_OUTPATIENT_CLINIC_OR_DEPARTMENT_OTHER): Payer: 59

## 2023-07-27 DIAGNOSIS — R1110 Vomiting, unspecified: Secondary | ICD-10-CM | POA: Diagnosis not present

## 2023-07-27 DIAGNOSIS — R0602 Shortness of breath: Secondary | ICD-10-CM | POA: Insufficient documentation

## 2023-07-27 DIAGNOSIS — R079 Chest pain, unspecified: Secondary | ICD-10-CM | POA: Diagnosis present

## 2023-07-27 DIAGNOSIS — N939 Abnormal uterine and vaginal bleeding, unspecified: Secondary | ICD-10-CM | POA: Diagnosis not present

## 2023-07-27 DIAGNOSIS — R1013 Epigastric pain: Secondary | ICD-10-CM | POA: Insufficient documentation

## 2023-07-27 LAB — CBC
HCT: 33.2 % — ABNORMAL LOW (ref 36.0–46.0)
Hemoglobin: 9.8 g/dL — ABNORMAL LOW (ref 12.0–15.0)
MCH: 22.5 pg — ABNORMAL LOW (ref 26.0–34.0)
MCHC: 29.5 g/dL — ABNORMAL LOW (ref 30.0–36.0)
MCV: 76.3 fL — ABNORMAL LOW (ref 80.0–100.0)
Platelets: 280 10*3/uL (ref 150–400)
RBC: 4.35 MIL/uL (ref 3.87–5.11)
RDW: 17 % — ABNORMAL HIGH (ref 11.5–15.5)
WBC: 6.1 10*3/uL (ref 4.0–10.5)
nRBC: 0 % (ref 0.0–0.2)

## 2023-07-27 LAB — BASIC METABOLIC PANEL
Anion gap: 12 (ref 5–15)
BUN: 7 mg/dL (ref 6–20)
CO2: 21 mmol/L — ABNORMAL LOW (ref 22–32)
Calcium: 8.8 mg/dL — ABNORMAL LOW (ref 8.9–10.3)
Chloride: 104 mmol/L (ref 98–111)
Creatinine, Ser: 0.8 mg/dL (ref 0.44–1.00)
GFR, Estimated: >60 mL/min (ref >60)
Glucose, Bld: 100 mg/dL — ABNORMAL HIGH (ref 70–99)
Potassium: 3.6 mmol/L (ref 3.5–5.1)
Sodium: 137 mmol/L (ref 135–145)

## 2023-07-27 LAB — HEPATIC FUNCTION PANEL
ALT: 15 U/L (ref 0–44)
AST: 18 U/L (ref 15–41)
Albumin: 3.9 g/dL (ref 3.5–5.0)
Alkaline Phosphatase: 53 U/L (ref 38–126)
Bilirubin, Direct: 0.1 mg/dL (ref 0.0–0.2)
Indirect Bilirubin: 0.5 mg/dL (ref 0.3–0.9)
Total Bilirubin: 0.6 mg/dL (ref 0.3–1.2)
Total Protein: 8.1 g/dL (ref 6.5–8.1)

## 2023-07-27 LAB — HCG, SERUM, QUALITATIVE: Preg, Serum: NEGATIVE

## 2023-07-27 LAB — TROPONIN I (HIGH SENSITIVITY)
Troponin I (High Sensitivity): 2 ng/L (ref <18)
Troponin I (High Sensitivity): 2 ng/L (ref <18)

## 2023-07-27 LAB — D-DIMER, QUANTITATIVE: D-Dimer, Quant: 1.52 ug{FEU}/mL — ABNORMAL HIGH (ref 0.00–0.50)

## 2023-07-27 LAB — LIPASE, BLOOD: Lipase: 24 U/L (ref 11–51)

## 2023-07-27 MED ORDER — SODIUM CHLORIDE 0.9 % IV BOLUS
1000.0000 mL | Freq: Once | INTRAVENOUS | Status: AC
  Administered 2023-07-27: 1000 mL via INTRAVENOUS

## 2023-07-27 MED ORDER — MORPHINE SULFATE (PF) 4 MG/ML IV SOLN
4.0000 mg | Freq: Once | INTRAVENOUS | Status: AC
  Administered 2023-07-27: 4 mg via INTRAVENOUS
  Filled 2023-07-27: qty 1

## 2023-07-27 MED ORDER — ALUM & MAG HYDROXIDE-SIMETH 200-200-20 MG/5ML PO SUSP
30.0000 mL | Freq: Once | ORAL | Status: AC
  Administered 2023-07-27: 30 mL via ORAL
  Filled 2023-07-27: qty 30

## 2023-07-27 MED ORDER — ONDANSETRON HCL 4 MG/2ML IJ SOLN
4.0000 mg | Freq: Once | INTRAMUSCULAR | Status: AC
  Administered 2023-07-27: 4 mg via INTRAVENOUS
  Filled 2023-07-27: qty 2

## 2023-07-27 MED ORDER — IOHEXOL 350 MG/ML SOLN
75.0000 mL | Freq: Once | INTRAVENOUS | Status: AC | PRN
  Administered 2023-07-27: 75 mL via INTRAVENOUS

## 2023-07-27 NOTE — ED Triage Notes (Signed)
POV from home, A&O x 4, gcs 15, amb to triage  Pt c/o chest pain, sob and excessive period bleeding 2 weeks out of the months, unsure how many pads a day.

## 2023-07-27 NOTE — ED Provider Notes (Signed)
  Provider Note MRN:  [id number]  Arrival date & time: 07/28/23    ED Course and Medical Decision Making  Assumed care from Dr. Silverio Lay at shift change.  Vaginal bleeding with reassuring pelvic exam still awaiting hCG status.  Also with chest pain awaiting CT imaging.  hCG negative.  CT imaging unremarkable, patient feels a lot better, no symptoms, normal vital signs.  Appropriate for discharge.  Procedures  Final Clinical Impressions(s) / ED Diagnoses     ICD-10-CM   1. Chest pain, unspecified type  R07.9       ED Discharge Orders     None         Discharge Instructions      You were evaluated in the Emergency Department and after careful evaluation, we did not find any emergent condition requiring admission or further testing in the hospital.  Your exam/testing today is overall reassuring.  Recommend follow-up with your primary care doctor as well as her OB/GYN doctor.  Please return to the Emergency Department if you experience any worsening of your condition.   Thank you for allowing [place] to be a part of your care.      Elmer Sow. Pilar Plate, MD [facility] Emergency Medicine [facility] [email or web address]    Sabas Sous, MD 07/28/23 0130

## 2023-07-27 NOTE — ED Provider Notes (Signed)
[place] [facility] Provider Note   CSN: [id number] Arrival date & time: 07/27/23  2032     History  Chief Complaint  Patient presents with   Chest Pain    Nancy Hull is a 47 y.o. female history of menorrhagia here presenting with vaginal bleeding.  She states that she has been having heavier menses for the last several months.  She called her doctor and referred to GYN outpatient.  She states that she had sudden onset of her menstrual cramps about 2 hours ago.  She states that she had about 2 pads since then.  She then vomited and then had left-sided chest pain.  She also has some shortness of breath subjectively as well.  Also has some epigastric pain.  Patient denies any cardiac history.  Denies any recent travel.  The history is provided by the patient.       Home Medications Prior to Admission medications   Medication Sig Start Date End Date Taking? Authorizing Provider  acetaminophen (TYLENOL) 500 MG tablet Take 2 tablets (1,000 mg total) by mouth every 6 (six) hours as needed. 10/09/18   Maczis, Elmer Sow, PA-C  benzonatate (TESSALON) 100 MG capsule Take 1 capsule (100 mg total) by mouth every 8 (eight) hours. 10/09/18   Maczis, Elmer Sow, PA-C  clindamycin (CLEOCIN) 150 MG capsule Take 3 capsules (450 mg total) by mouth 3 (three) times daily. 10/17/19   Law, Waylan Boga, PA-C  ibuprofen (ADVIL) 600 MG tablet Take 1 tablet (600 mg total) by mouth every 6 (six) hours as needed. 10/17/19   Law, Waylan Boga, PA-C  lidocaine (XYLOCAINE) 2 % solution Use as directed 15 mLs in the mouth or throat as needed for mouth pain. Patient not taking: Reported on 10/06/2018 07/05/16   Joy, Shawn C, PA-C  ondansetron (ZOFRAN ODT) 4 MG disintegrating tablet Take 1 tablet (4 mg total) by mouth every 8 (eight) hours as needed for nausea or vomiting. 08/16/21   Merwyn Katos, MD  ondansetron (ZOFRAN-ODT) 4 MG disintegrating tablet Take 1 tablet (4 mg  total) by mouth every 8 (eight) hours as needed for nausea or vomiting. 06/19/22   Tilden Fossa, MD  Saccharomyces boulardii (PROBIOTIC) 250 MG CAPS Take 250 mg by mouth daily. 10/17/19   Emi Holes, PA-C      Allergies    Patient has no known allergies.    Review of Systems   Review of Systems  Cardiovascular:  Positive for chest pain.  All other systems reviewed and are negative.   Physical Exam Updated Vital Signs BP (!) 148/73 (BP Location: Left Arm)   Pulse 79   Temp 98.1 F (36.7 C) (Oral)   Resp 16   Ht 5\' 2"  (1.575 m)   Wt 77.1 kg   LMP 07/27/2023   SpO2 99%   BMI 31.09 kg/m  Physical Exam Vitals and nursing note reviewed.  Constitutional:      Comments: Slightly uncomfortable  HENT:     Head: Normocephalic.  Eyes:     Extraocular Movements: Extraocular movements intact.     Pupils: Pupils are equal, round, and reactive to light.  Cardiovascular:     Rate and Rhythm: Normal rate and regular rhythm.     Heart sounds: Normal heart sounds.  Pulmonary:     Effort: Pulmonary effort is normal.     Breath sounds: Normal breath sounds.  Abdominal:     General: Bowel sounds are normal.  Palpations: Abdomen is soft.  Genitourinary:    Comments: Pelvic-os is closed.  Some blood in the vaginal vault.  No obvious CMT or adnexal tenderness Musculoskeletal:     Cervical back: Normal range of motion and neck supple.  Skin:    General: Skin is warm.     Capillary Refill: Capillary refill takes less than 2 seconds.  Neurological:     General: No focal deficit present.     Mental Status: She is alert.  Psychiatric:        Mood and Affect: Mood normal.        Behavior: Behavior normal.     ED Results / Procedures / Treatments   Labs (all labs ordered are listed, but only abnormal results are displayed) Labs Reviewed  BASIC METABOLIC PANEL - Abnormal; Notable for the following components:      Result Value   CO2 21 (*)    Glucose, Bld 100 (*)     Calcium 8.8 (*)    All other components within normal limits  CBC - Abnormal; Notable for the following components:   Hemoglobin 9.8 (*)    HCT 33.2 (*)    MCV 76.3 (*)    MCH 22.5 (*)    MCHC 29.5 (*)    RDW 17.0 (*)    All other components within normal limits  PREGNANCY, URINE  HEPATIC FUNCTION PANEL  LIPASE, BLOOD  D-DIMER, QUANTITATIVE  TROPONIN I (HIGH SENSITIVITY)    EKG None  Radiology DG Chest Portable 1 View  Result Date: 07/27/2023 CLINICAL DATA:  Chest pain, shortness of breath, and excessive. Bleeding 2 weeks out of the month. Smoker. EXAM: PORTABLE CHEST 1 VIEW COMPARISON:  10/06/2018 FINDINGS: The heart size and mediastinal contours are within normal limits. Both lungs are clear. The visualized skeletal structures are unremarkable. IMPRESSION: No active disease. Electronically Signed   By: Burman Nieves M.D.   On: 07/27/2023 22:06    Procedures Procedures    Medications Ordered in ED Medications  sodium chloride 0.9 % bolus 1,000 mL (has no administration in time range)  ondansetron (ZOFRAN) injection 4 mg (has no administration in time range)  alum & mag hydroxide-simeth (MAALOX/MYLANTA) 200-200-20 MG/5ML suspension 30 mL (has no administration in time range)  morphine (PF) 4 MG/ML injection 4 mg (has no administration in time range)    ED Course/ Medical Decision Making/ A&P                                 Medical Decision Making Nancy Hull is a 47 y.o. female here presenting with shortness of breath and left-sided chest pain.  She had her menses today and then had vomiting and chest pain.  Consider ACS versus PE versus reflux.  Patient is low risk for PE so we will get a D-dimer.  I performed pelvic exam there was no obvious CMT or adnexal tenderness.  Patient can have some fibroids causing her bleeding.  I do not think she has torsion.    11:04 PM Reviewed patient's labs and patient's D-dimer is elevated at 1.5.  Troponin initially was  negative.  CTA chest and CT abdomen pelvis ordered.  Anticipate that if they were unremarkable, patient will be able to be discharged.  Signed out to Dr. Pilar Plate in the ED.   Amount and/or Complexity of Data Reviewed Labs: ordered. Decision-making details documented in ED Course. Radiology: ordered and independent interpretation performed.  Decision-making details documented in ED Course.  Risk OTC drugs. Prescription drug management.    Final Clinical Impression(s) / ED Diagnoses Final diagnoses:  None    Rx / DC Orders ED Discharge Orders     None         Charlynne Pander, MD 07/27/23 2304

## 2023-07-28 NOTE — Discharge Instructions (Signed)
You were evaluated in the Emergency Department and after careful evaluation, we did not find any emergent condition requiring admission or further testing in the hospital.  Your exam/testing today is overall reassuring.  Recommend follow-up with your primary care doctor as well as her OB/GYN doctor.  Please return to the Emergency Department if you experience any worsening of your condition.   Thank you for allowing [place] to be a part of your care.

## 2023-09-23 ENCOUNTER — Other Ambulatory Visit (HOSPITAL_BASED_OUTPATIENT_CLINIC_OR_DEPARTMENT_OTHER): Payer: Self-pay

## 2024-02-29 ENCOUNTER — Other Ambulatory Visit: Payer: Self-pay

## 2024-02-29 ENCOUNTER — Emergency Department (HOSPITAL_BASED_OUTPATIENT_CLINIC_OR_DEPARTMENT_OTHER)
Admission: EM | Admit: 2024-02-29 | Discharge: 2024-02-29 | Disposition: A | Discharge status: Home / Self Care | Attending: Emergency Medicine | Admitting: Emergency Medicine

## 2024-02-29 ENCOUNTER — Emergency Department (HOSPITAL_BASED_OUTPATIENT_CLINIC_OR_DEPARTMENT_OTHER)

## 2024-02-29 ENCOUNTER — Encounter (HOSPITAL_BASED_OUTPATIENT_CLINIC_OR_DEPARTMENT_OTHER): Payer: Self-pay | Admitting: Emergency Medicine

## 2024-02-29 DIAGNOSIS — G43819 Other migraine, intractable, without status migrainosus: Secondary | ICD-10-CM | POA: Insufficient documentation

## 2024-02-29 DIAGNOSIS — R519 Headache, unspecified: Secondary | ICD-10-CM | POA: Diagnosis present

## 2024-02-29 MED ORDER — PROMETHAZINE HCL 25 MG/ML IJ SOLN
INTRAMUSCULAR | Status: AC
  Administered 2024-02-29: 25 mg
  Filled 2024-02-29: qty 1

## 2024-02-29 MED ORDER — ACETAMINOPHEN 325 MG PO TABS
650.0000 mg | ORAL_TABLET | Freq: Once | ORAL | Status: AC
  Administered 2024-02-29: 650 mg via ORAL
  Filled 2024-02-29: qty 2

## 2024-02-29 MED ORDER — SODIUM CHLORIDE 0.9 % IV SOLN
12.5000 mg | Freq: Once | INTRAVENOUS | Status: AC
  Administered 2024-02-29: 12.5 mg via INTRAVENOUS
  Filled 2024-02-29: qty 0.5

## 2024-02-29 MED ORDER — SODIUM CHLORIDE 0.9 % IV BOLUS
1000.0000 mL | Freq: Once | INTRAVENOUS | Status: AC
  Administered 2024-02-29: 1000 mL via INTRAVENOUS

## 2024-02-29 MED ORDER — KETOROLAC TROMETHAMINE 15 MG/ML IJ SOLN
15.0000 mg | Freq: Once | INTRAMUSCULAR | Status: AC
  Administered 2024-02-29: 15 mg via INTRAVENOUS
  Filled 2024-02-29: qty 1

## 2024-02-29 MED ORDER — METHYLPREDNISOLONE SODIUM SUCC 125 MG IJ SOLR
125.0000 mg | Freq: Once | INTRAMUSCULAR | Status: AC
  Administered 2024-02-29: 125 mg via INTRAVENOUS
  Filled 2024-02-29: qty 2

## 2024-02-29 NOTE — ED Provider Notes (Signed)
 [place] [facility] Provider Note   CSN: [id number] Arrival date & time: 02/29/24  2048     History  Chief Complaint  Patient presents with   Headache    Nancy Hull is a 48 y.o. female.  Patient is a 48 year old female with past medical history of migraines presenting for headache.  Patient states headache started at 1400 today.  She describes it as frontal in nature and involving bilateral eyes.  She admits to photophobia.  Admits to some nausea due to pain.  Some improvement after BC powder but pain returned.  Denies any neurovascular deficits.  Admits to nausea without vomiting.  The history is provided by the patient. No language interpreter was used.  Headache Associated symptoms: photophobia   Associated symptoms: no abdominal pain, no back pain, no cough, no ear pain, no eye pain, no fever, no seizures, no sore throat and no vomiting        Home Medications Prior to Admission medications   Medication Sig Start Date End Date Taking? Authorizing Provider  acetaminophen  (TYLENOL ) 500 MG tablet Take 2 tablets (1,000 mg total) by mouth every 6 (six) hours as needed. 10/09/18   Maczis, Michael M, PA-C  benzonatate  (TESSALON ) 100 MG capsule Take 1 capsule (100 mg total) by mouth every 8 (eight) hours. 10/09/18   Maczis, Michael M, PA-C  clindamycin  (CLEOCIN ) 150 MG capsule Take 3 capsules (450 mg total) by mouth 3 (three) times daily. 10/17/19   Law, Alexandra M, PA-C  ibuprofen  (ADVIL ) 600 MG tablet Take 1 tablet (600 mg total) by mouth every 6 (six) hours as needed. 10/17/19   Law, Darnell Elbe, PA-C  lidocaine  (XYLOCAINE ) 2 % solution Use as directed 15 mLs in the mouth or throat as needed for mouth pain. Patient not taking: Reported on 10/06/2018 07/05/16   Joy, Zackery Herring, PA-C  ondansetron  (ZOFRAN  ODT) 4 MG disintegrating tablet Take 1 tablet (4 mg total) by mouth every 8 (eight) hours as needed for nausea or vomiting. 08/16/21    Bradler, Evan K, MD  ondansetron  (ZOFRAN -ODT) 4 MG disintegrating tablet Take 1 tablet (4 mg total) by mouth every 8 (eight) hours as needed for nausea or vomiting. 06/19/22   Kelsey Patricia, MD  Saccharomyces boulardii (PROBIOTIC) 250 MG CAPS Take 250 mg by mouth daily. 10/17/19   Law, Alexandra M, PA-C      Allergies    Patient has no known allergies.    Review of Systems   Review of Systems  Constitutional:  Negative for chills and fever.  HENT:  Negative for ear pain and sore throat.   Eyes:  Positive for photophobia. Negative for pain and visual disturbance.  Respiratory:  Negative for cough and shortness of breath.   Cardiovascular:  Negative for chest pain and palpitations.  Gastrointestinal:  Negative for abdominal pain and vomiting.  Genitourinary:  Negative for dysuria and hematuria.  Musculoskeletal:  Negative for arthralgias and back pain.  Skin:  Negative for color change and rash.  Neurological:  Positive for headaches. Negative for seizures and syncope.  All other systems reviewed and are negative.   Physical Exam Updated Vital Signs BP (!) 156/72   Pulse (!) 59   Temp 98.1 F (36.7 C)   Resp 20   Ht 5\' 2"  (1.575 m)   Wt 72.6 kg   SpO2 94%   BMI 29.26 kg/m  Physical Exam Vitals and nursing note reviewed.  Constitutional:  General: She is not in acute distress.    Appearance: She is well-developed.  HENT:     Head: Normocephalic and atraumatic.  Eyes:     General: Lids are normal. Vision grossly intact.     Conjunctiva/sclera: Conjunctivae normal.     Pupils: Pupils are equal, round, and reactive to light.  Cardiovascular:     Rate and Rhythm: Normal rate and regular rhythm.     Heart sounds: No murmur heard. Pulmonary:     Effort: Pulmonary effort is normal. No respiratory distress.     Breath sounds: Normal breath sounds.  Abdominal:     Palpations: Abdomen is soft.     Tenderness: There is no abdominal tenderness.  Musculoskeletal:         General: No swelling.     Cervical back: Neck supple.  Skin:    General: Skin is warm and dry.     Capillary Refill: Capillary refill takes less than 2 seconds.  Neurological:     General: No focal deficit present.     Mental Status: She is alert and oriented to person, place, and time.     GCS: GCS eye subscore is 4. GCS verbal subscore is 5. GCS motor subscore is 6.     Cranial Nerves: Cranial nerves 2-12 are intact.     Sensory: Sensation is intact.     Motor: Motor function is intact.     Coordination: Coordination is intact.  Psychiatric:        Mood and Affect: Mood normal.     ED Results / Procedures / Treatments   Labs (all labs ordered are listed, but only abnormal results are displayed) Labs Reviewed - No data to display  EKG None  Radiology CT Head Wo Contrast Result Date: 02/29/2024 CLINICAL DATA:  Sudden onset headaches, initial encounter EXAM: CT HEAD WITHOUT CONTRAST TECHNIQUE: Contiguous axial images were obtained from the base of the skull through the vertex without intravenous contrast. RADIATION DOSE REDUCTION: This exam was performed according to the departmental dose-optimization program which includes automated exposure control, adjustment of the mA and/or kV according to patient size and/or use of iterative reconstruction technique. COMPARISON:  02/12/2016 FINDINGS: Brain: No evidence of acute infarction, hemorrhage, hydrocephalus, extra-axial collection or mass lesion/mass effect. Vascular: No hyperdense vessel or unexpected calcification. Skull: Normal. Negative for fracture or focal lesion. Sinuses/Orbits: No acute finding. Other: None. IMPRESSION: No acute intracranial abnormality noted. Electronically Signed   By: Violeta Grey M.D.   On: 02/29/2024 22:37    Procedures Procedures    Medications Ordered in ED Medications  ketorolac  (TORADOL ) 15 MG/ML injection 15 mg (has no administration in time range)  sodium chloride  0.9 % bolus 1,000 mL (1,000 mLs  Intravenous New Bag/Given 02/29/24 2208)  promethazine (PHENERGAN) 12.5 mg in sodium chloride  0.9 % 50 mL IVPB (12.5 mg Intravenous New Bag/Given 02/29/24 2244)  methylPREDNISolone  sodium succinate (SOLU-MEDROL ) 125 mg/2 mL injection 125 mg (125 mg Intravenous Given 02/29/24 2212)  acetaminophen  (TYLENOL ) tablet 650 mg (650 mg Oral Given 02/29/24 2203)  promethazine (PHENERGAN) 25 MG/ML injection (25 mg  Given 02/29/24 2243)    ED Course/ Medical Decision Making/ A&P                                 Medical Decision Making Amount and/or Complexity of Data Reviewed Radiology: ordered.  Risk OTC drugs. Prescription drug management.  48 year old female with past medical  history of migraines presenting for headache.  Is alert oriented x 3, no acute distress, afebrile, so vital signs.  Physical exam demonstrates no neurovascular deficits.  CT head ordered and pending.  In the meantime migraine cocktail started with Phenergan, IV fluids, Solu-Medrol , and Tylenol .  Will add on Toradol  if CT head stable.  CT head demonstrates no acute process.  Toradol  added.  On reevaluation patient states her pain has gone from a 10 out of 10 to 2 out of 10 pain severity.  She afebrile with no signs or symptoms of meningitis.  No neurovascular deficits.  Safe to return home and follow-up with PCP for further migraine management.  Patient in no distress and overall condition improved here in the ED. Detailed discussions were had with the patient regarding current findings, and need for close f/u with PCP or on call doctor. The patient has been instructed to return immediately if the symptoms worsen in any way for re-evaluation. Patient verbalized understanding and is in agreement with current care plan. All questions answered prior to discharge.         Final Clinical Impression(s) / ED Diagnoses Final diagnoses:  Other migraine without status migrainosus, intractable    Rx / DC Orders ED Discharge Orders      None         Quinn Bucco, DO 02/29/24 2313

## 2024-02-29 NOTE — ED Triage Notes (Signed)
 Pt reports HA since 1400, which she describes as a migraine; BC powder helped for a little while, then pain came back

## 2024-08-27 ENCOUNTER — Emergency Department (HOSPITAL_BASED_OUTPATIENT_CLINIC_OR_DEPARTMENT_OTHER)

## 2024-08-27 ENCOUNTER — Other Ambulatory Visit: Payer: Self-pay

## 2024-08-27 ENCOUNTER — Encounter (HOSPITAL_BASED_OUTPATIENT_CLINIC_OR_DEPARTMENT_OTHER): Payer: Self-pay

## 2024-08-27 ENCOUNTER — Emergency Department (HOSPITAL_BASED_OUTPATIENT_CLINIC_OR_DEPARTMENT_OTHER)
Admission: EM | Admit: 2024-08-27 | Discharge: 2024-08-27 | Disposition: A | Discharge status: Home / Self Care | Attending: Emergency Medicine | Admitting: Emergency Medicine

## 2024-08-27 DIAGNOSIS — R079 Chest pain, unspecified: Secondary | ICD-10-CM | POA: Diagnosis present

## 2024-08-27 DIAGNOSIS — R0789 Other chest pain: Secondary | ICD-10-CM | POA: Insufficient documentation

## 2024-08-27 DIAGNOSIS — R7989 Other specified abnormal findings of blood chemistry: Secondary | ICD-10-CM | POA: Diagnosis not present

## 2024-08-27 LAB — CBC
HCT: 40.4 % (ref 36.0–46.0)
Hemoglobin: 13 g/dL (ref 12.0–15.0)
MCH: 29.1 pg (ref 26.0–34.0)
MCHC: 32.2 g/dL (ref 30.0–36.0)
MCV: 90.4 fL (ref 80.0–100.0)
Platelets: 243 K/uL (ref 150–400)
RBC: 4.47 MIL/uL (ref 3.87–5.11)
RDW: 20.5 % — ABNORMAL HIGH (ref 11.5–15.5)
WBC: 7.9 K/uL (ref 4.0–10.5)
nRBC: 0 % (ref 0.0–0.2)

## 2024-08-27 LAB — BASIC METABOLIC PANEL WITH GFR
Anion gap: 14 (ref 5–15)
BUN: 9 mg/dL (ref 6–20)
CO2: 21 mmol/L — ABNORMAL LOW (ref 22–32)
Calcium: 9 mg/dL (ref 8.9–10.3)
Chloride: 103 mmol/L (ref 98–111)
Creatinine, Ser: 0.83 mg/dL (ref 0.44–1.00)
GFR, Estimated: >60 mL/min (ref >60)
Glucose, Bld: 90 mg/dL (ref 70–99)
Potassium: 3.9 mmol/L (ref 3.5–5.1)
Sodium: 137 mmol/L (ref 135–145)

## 2024-08-27 LAB — D-DIMER, QUANTITATIVE: D-Dimer, Quant: 1.6 ug{FEU}/mL — ABNORMAL HIGH (ref 0.00–0.50)

## 2024-08-27 LAB — TROPONIN T, HIGH SENSITIVITY
Troponin T High Sensitivity: <15 ng/L (ref 0–19)
Troponin T High Sensitivity: <15 ng/L (ref 0–19)

## 2024-08-27 MED ORDER — HYDROMORPHONE HCL 1 MG/ML IJ SOLN
0.5000 mg | Freq: Once | INTRAMUSCULAR | Status: AC
  Administered 2024-08-27: 0.5 mg via INTRAVENOUS
  Filled 2024-08-27: qty 1

## 2024-08-27 MED ORDER — SODIUM CHLORIDE 0.9 % IV BOLUS
1000.0000 mL | Freq: Once | INTRAVENOUS | Status: AC
  Administered 2024-08-27: 1000 mL via INTRAVENOUS

## 2024-08-27 MED ORDER — KETOROLAC TROMETHAMINE 15 MG/ML IJ SOLN
15.0000 mg | Freq: Once | INTRAMUSCULAR | Status: AC
Start: 2024-08-27 — End: 2024-08-27
  Administered 2024-08-27: 15 mg via INTRAVENOUS
  Filled 2024-08-27: qty 1

## 2024-08-27 MED ORDER — OXYCODONE HCL 5 MG PO TABS: 5.0000 mg | ORAL_TABLET | Freq: Four times a day (QID) | ORAL | 0 refills | Status: AC | PRN

## 2024-08-27 MED ORDER — ONDANSETRON HCL 4 MG/2ML IJ SOLN
4.0000 mg | Freq: Once | INTRAMUSCULAR | Status: AC
  Administered 2024-08-27: 4 mg via INTRAVENOUS
  Filled 2024-08-27: qty 2

## 2024-08-27 MED ORDER — IOHEXOL 350 MG/ML SOLN
75.0000 mL | Freq: Once | INTRAVENOUS | Status: AC | PRN
  Administered 2024-08-27: 75 mL via INTRAVENOUS

## 2024-08-27 MED ORDER — HYDROMORPHONE HCL 1 MG/ML IJ SOLN
1.0000 mg | Freq: Once | INTRAMUSCULAR | Status: AC
  Administered 2024-08-27: 1 mg via INTRAVENOUS
  Filled 2024-08-27: qty 1

## 2024-08-27 NOTE — ED Provider Notes (Signed)
 [facility] Provider Note   CSN: [id number] Arrival date & time: 08/27/24  1223     Patient presents with: Chest Pain   Nancy Hull is a 48 y.o. female who presents to the emergency department with a chief complaint of left-sided chest pain under her left breast.  Patient states that she had mild pain in this area yesterday and then earlier today while at work pain became very severe and is now 10 out of 10 pain.  Patient states that she is breathing quickly due to the pain.  Denies previous cardiac history and denies history of blood clot.  Patient does have a history of stomach ulcer which they think was caused by large amounts of anti-inflammatory medications.  Also has history of left breast abscess which had to be drained.  Denies known trauma/injury.  Denies fever or chills.  Patient did have 1 episode of vomiting yesterday, denies hematemesis.  No vomiting today.  Patient states that she was at work lifting something approximately 15 to 20 pounds whenever the severe pain started.  Patient states that the pain is most severe under her left breast and radiates to her left flank around to her back.  Also does appreciate pain when taking a deep breath.  Past medical history significant for tobacco abuse, C-section, breast abscess, etc. Denies calf redness, swelling, tenderness, recent trauma or surgery, history of blood clot, recent immobilization, known hypercoagulable state, oral birth control, etc.    Chest Pain      Prior to Admission medications   Medication Sig Start Date End Date Taking? Authorizing Provider  oxyCODONE  (ROXICODONE ) 5 MG immediate release tablet Take 1 tablet (5 mg total) by mouth every 6 (six) hours as needed for breakthrough pain. 08/27/24  Yes Brandy Kabat F, PA-C  acetaminophen  (TYLENOL ) 500 MG tablet Take 2 tablets (1,000 mg total) by mouth every 6 (six) hours as needed. 10/09/18   Maczis, Michael M,  PA-C  benzonatate  (TESSALON ) 100 MG capsule Take 1 capsule (100 mg total) by mouth every 8 (eight) hours. 10/09/18   Maczis, Michael M, PA-C  clindamycin  (CLEOCIN ) 150 MG capsule Take 3 capsules (450 mg total) by mouth 3 (three) times daily. 10/17/19   Law, Alexandra M, PA-C  ibuprofen  (ADVIL ) 600 MG tablet Take 1 tablet (600 mg total) by mouth every 6 (six) hours as needed. 10/17/19   Law, Alexandra M, PA-C  lidocaine  (XYLOCAINE ) 2 % solution Use as directed 15 mLs in the mouth or throat as needed for mouth pain. Patient not taking: Reported on 10/06/2018 07/05/16   Joy, Shawn C, PA-C  ondansetron  (ZOFRAN  ODT) 4 MG disintegrating tablet Take 1 tablet (4 mg total) by mouth every 8 (eight) hours as needed for nausea or vomiting. 08/16/21   Bradler, Evan K, MD  ondansetron  (ZOFRAN -ODT) 4 MG disintegrating tablet Take 1 tablet (4 mg total) by mouth every 8 (eight) hours as needed for nausea or vomiting. 06/19/22   Griselda Norris, MD  Saccharomyces boulardii (PROBIOTIC) 250 MG CAPS Take 250 mg by mouth daily. 10/17/19   Rendell Lorane HERO, PA-C    Allergies: Nsaids    Review of Systems  Cardiovascular:  Positive for chest pain.    Updated Vital Signs BP 119/69   Pulse (!) 52   Temp 98.6 F (37 C) (Oral)   Resp 16   LMP 08/27/2024   SpO2 97%   Physical Exam Vitals and nursing note reviewed.  Constitutional:  General: She is awake. She is not in acute distress.    Appearance: Normal appearance. She is well-developed. She is not ill-appearing, toxic-appearing or diaphoretic.  HENT:     Head: Normocephalic and atraumatic.  Cardiovascular:     Rate and Rhythm: Normal rate and regular rhythm.  Pulmonary:     Effort: Pulmonary effort is normal. Tachypnea present. No respiratory distress.     Breath sounds: Normal breath sounds. No decreased breath sounds, wheezing, rhonchi or rales.  Chest:     Chest wall: Tenderness (skin tender even with light touch under L breast area) present.   Musculoskeletal:        General: Normal range of motion.     Cervical back: Normal range of motion.     Right lower leg: No tenderness. No edema.     Left lower leg: No tenderness. No edema.  Skin:    General: Skin is warm.     Capillary Refill: Capillary refill takes less than 2 seconds.     Comments: No obvious rashes or lesions on left side of chest or under breast  No appreciated masses or swelling to left breast, no color changes  Neurological:     General: No focal deficit present.     Mental Status: She is alert and oriented to person, place, and time.  Psychiatric:        Behavior: Behavior is cooperative.     (all labs ordered are listed, but only abnormal results are displayed) Labs Reviewed  BASIC METABOLIC PANEL WITH GFR - Abnormal; Notable for the following components:      Result Value   CO2 21 (*)    All other components within normal limits  CBC - Abnormal; Notable for the following components:   RDW 20.5 (*)    All other components within normal limits  D-DIMER, QUANTITATIVE - Abnormal; Notable for the following components:   D-Dimer, Quant 1.60 (*)    All other components within normal limits  TROPONIN T, HIGH SENSITIVITY  TROPONIN T, HIGH SENSITIVITY    EKG: EKG Interpretation Date/Time:  Thursday August 27 2024 12:32:16 EDT Ventricular Rate:  83 PR Interval:  142 QRS Duration:  91 QT Interval:  382 QTC Calculation: 449 R Axis:   62  Text Interpretation: Sinus rhythm Baseline wander in lead(s) II III aVF V4 V5 Confirmed by Darra Chew [phone]) on 08/27/2024 12:44:18 PM  Radiology: CT Angio Chest PE W and/or Wo Contrast Result Date: 08/27/2024 EXAM: CTA of the Chest with contrast for PE 08/27/2024 02:53:01 PM TECHNIQUE: CTA of the chest was performed after the administration of intravenous contrast. Multiplanar reformatted images are provided for review. MIP images are provided for review. Automated exposure control, iterative reconstruction,  and/or weight based adjustment of the mA/kV was utilized to reduce the radiation dose to as low as reasonably achievable. COMPARISON: 07/27/2023 CLINICAL HISTORY: FINDINGS: PULMONARY ARTERIES: Pulmonary arteries are adequately opacified for evaluation. No pulmonary embolism. Main pulmonary artery is normal in caliber. MEDIASTINUM: The heart and pericardium demonstrate no acute abnormality. There is no acute abnormality of the thoracic aorta. LYMPH NODES: No mediastinal, hilar or axillary lymphadenopathy. LUNGS AND PLEURA: Minimal left basilar subsegmental atelectasis. No focal consolidation or pulmonary edema. No pleural effusion or pneumothorax. UPPER ABDOMEN: Limited images of the upper abdomen are unremarkable. SOFT TISSUES AND BONES: No acute bone or soft tissue abnormality. IMPRESSION: 1. No pulmonary embolism. Electronically signed by: Lynwood Seip MD 08/27/2024 03:02 PM EDT RP Workstation: [id number]   DG  Chest Port 1 View Result Date: 08/27/2024 CLINICAL DATA:  Chest pain. EXAM: PORTABLE CHEST 1 VIEW COMPARISON:  Chest radiograph dated 07/27/2023. FINDINGS: No focal consolidation, pleural effusion, or pneumothorax. The cardiac silhouette is within normal limits. No acute osseous pathology. IMPRESSION: No active disease. Electronically Signed   By: Vanetta Chou M.D.   On: 08/27/2024 13:44     Procedures   Medications Ordered in the ED  HYDROmorphone (DILAUDID) injection 0.5 mg (0.5 mg Intravenous Given 08/27/24 1342)  ondansetron  (ZOFRAN ) injection 4 mg (4 mg Intravenous Given 08/27/24 1342)  sodium chloride  0.9 % bolus 1,000 mL (0 mLs Intravenous Stopped 08/27/24 1516)  HYDROmorphone (DILAUDID) injection 1 mg (1 mg Intravenous Given 08/27/24 1421)  iohexol  (OMNIPAQUE ) 350 MG/ML injection 75 mL (75 mLs Intravenous Contrast Given 08/27/24 1438)  ketorolac  (TORADOL ) 15 MG/ML injection 15 mg (15 mg Intravenous Given 08/27/24 1623)    Clinical Course as of 08/27/24 1907  Thu Aug 27, 2024   1413 Elevated D-dimer [CH]  1507 Negative for PE, consider Toradol  if pain returns [CH]    Clinical Course User Index [CH] Neeta Storey, Terrall FALCON, PA-C                                 Medical Decision Making Amount and/or Complexity of Data Reviewed Labs: ordered. Radiology: ordered.  Risk Prescription drug management.   Patient presents to the ED for concern of chest pain, this involves an extensive number of treatment options, and is a complaint that carries with it a high risk of complications and morbidity.  The differential diagnosis includes ACS, pulmonary embolism, dissection, pneumothorax, pneumonia, trauma/injury, shingles, breast abscess, etc.   Co morbidities that complicate the patient evaluation   tobacco abuse, C-section, breast abscess, etc   Lab Tests:  I Ordered, and personally interpreted labs.  The pertinent results include: D-dimer elevated at 1.6, CBC unremarkable, BMP unremarkable, troponin x 2 less than 15   Imaging Studies ordered:  I ordered imaging studies including chest x-ray, CT PE study I independently visualized and interpreted imaging which showed no active disease, no evidence of PE I agree with the radiologist interpretation   Cardiac Monitoring:  The patient was maintained on a cardiac monitor.  I personally viewed and interpreted the cardiac monitored which showed an underlying rhythm of: Sinus rhythm   Medicines ordered and prescription drug management:  I ordered medication including Dilaudid, Toradol  for pain, Zofran  for nausea, fluids for nausea and vomiting Reevaluation of the patient after these medicines showed that the patient improved I have reviewed the patients home medicines and have made adjustments as needed   Test Considered:  None   Critical Interventions:  None   Problem List / ED Course:  48 year old female, vital signs stable however noted to be tachypneic as well as hypertensive who presents emergency  department with L sided chest pain, on initial examination patient clearly uncomfortable and crying in pain, also has significant pain with movement of left arm On initial physical exam, no obvious abnormality with auscultation of heart or lungs however left-sided chest wall specifically under left breast extremely tender to even mild palpation, no abnormality on breast exam, no skin lesions or rashes, nursing chaperone present for exam Due to level of pain we will treat symptomatically and also obtain general chest pain workup, due to pain when taking a deep breath will order D-dimer as well Overall chest pain workup reassuring other than  elevated D-dimer, CT PE scan ordered which was negative, chest x-ray also reassuring, troponins less than 15 x 2, EKG shows no acute ischemic changes by my interpretation On reassessment patient much improved after pain medication, overall reassuring encounter today Instructed patient that a diagnosis was not found however she improved with pain medications here in the emergency department, instructed patient to follow-up with primary care provider and make them aware of all workup and findings today, specifically instructed them to be on the look out for development of a rash or signs of infection/abscess to the left breast, patient understanding of this Return precautions given Patient discharged Unknown diagnosis of chest pain at this time however workup reassuring as well as imaging, patient improved with symptomatic treatment, no sign of ACS, PE, or dissection and evaluation today, patient monitored in the emergency department for multiple hours with stable vital signs and improvement after symptomatic treatment, no sign of breast abscess or infection at this time   Reevaluation:  After the interventions noted above, I reevaluated the patient and found that they have :improved   Social Determinants of Health:  None   Dispostion:  After consideration of  the diagnostic results and the patients response to treatment, I feel that the patient would benefit from discharge and outpatient therapy as described, follow-up with primary care provider.       Final diagnoses:  Chest pain, unspecified type    ED Discharge Orders          Ordered    oxyCODONE  (ROXICODONE ) 5 MG immediate release tablet  Every 6 hours PRN        08/27/24 1737               Rumaysa Sabatino F, PA-C 08/27/24 1907    Long, Joshua G, MD 09/01/24 [phone]

## 2024-08-27 NOTE — ED Triage Notes (Signed)
 L sided chest pain for 1 hr. SHOB, N/V. Pain worse when moving L arm or deep breathing.    Pt crying in triage, family speaking for pt

## 2024-08-27 NOTE — ED Notes (Signed)
 ED Provider at bedside.

## 2024-08-27 NOTE — Discharge Instructions (Addendum)
 It was a pleasure taking care of you today.  Based on your history and physical exam as well as labs and imaging I feel you are safe for discharge.  Today your chest pain improved with symptomatic treatment while in the emergency department.  On your workup today there was no evidence of heart attack or blood clot in your lung.  Please continue to monitor your symptoms, if you develop a rash underneath your left breast I recommend that you be seen again in the emergency department or by your primary care provider as this may be evidence of shingles.  Please also continue to monitor your breast to make sure it does not become warm, develop a rash, or other sign of infection or abscess.  Due to your level of pain I recommend continued Tylenol  at home, please not exceed the max daily dose of 4000 mg/day or more than 1000 mg in a single dose, I have also sent in a few tablets of oxycodone  which is a narcotic pain medication.  Please do not drive or operate heavy machinery after taking the oxycodone  as it may make you drowsy.  If symptoms persist or worsen recommend follow-up within 48 hours either back in the emergency department or with your primary care.  Please make your primary care provider aware of your workup and all findings today.  If you experience and the following symptoms including but not limited to fever, chills, chest pain, shortness of breath, vomiting up blood, blood in stool, severe abdominal pain, or other concerning symptom please return the emergency department or seek further medical care.

## 2024-10-11 ENCOUNTER — Other Ambulatory Visit: Payer: Self-pay

## 2024-10-11 ENCOUNTER — Emergency Department (HOSPITAL_BASED_OUTPATIENT_CLINIC_OR_DEPARTMENT_OTHER)
Admission: EM | Admit: 2024-10-11 | Discharge: 2024-10-11 | Disposition: A | Discharge status: Home / Self Care | Attending: Emergency Medicine | Admitting: Emergency Medicine

## 2024-10-11 ENCOUNTER — Encounter (HOSPITAL_BASED_OUTPATIENT_CLINIC_OR_DEPARTMENT_OTHER): Payer: Self-pay | Admitting: *Deleted

## 2024-10-11 DIAGNOSIS — Z79899 Other long term (current) drug therapy: Secondary | ICD-10-CM | POA: Insufficient documentation

## 2024-10-11 DIAGNOSIS — G43719 Chronic migraine without aura, intractable, without status migrainosus: Secondary | ICD-10-CM | POA: Insufficient documentation

## 2024-10-11 DIAGNOSIS — G43909 Migraine, unspecified, not intractable, without status migrainosus: Secondary | ICD-10-CM

## 2024-10-11 MED ORDER — SODIUM CHLORIDE 0.9 % IV BOLUS
1000.0000 mL | Freq: Once | INTRAVENOUS | Status: AC
  Administered 2024-10-11: 1000 mL via INTRAVENOUS

## 2024-10-11 MED ORDER — DIPHENHYDRAMINE HCL 50 MG/ML IJ SOLN
25.0000 mg | Freq: Once | INTRAMUSCULAR | Status: AC
  Administered 2024-10-11: 25 mg via INTRAVENOUS
  Filled 2024-10-11: qty 1

## 2024-10-11 MED ORDER — KETOROLAC TROMETHAMINE 30 MG/ML IJ SOLN
30.0000 mg | Freq: Once | INTRAMUSCULAR | Status: AC
  Administered 2024-10-11: 30 mg via INTRAVENOUS
  Filled 2024-10-11: qty 1

## 2024-10-11 MED ORDER — PROCHLORPERAZINE EDISYLATE 10 MG/2ML IJ SOLN
10.0000 mg | Freq: Once | INTRAMUSCULAR | Status: AC
  Administered 2024-10-11: 10 mg via INTRAVENOUS
  Filled 2024-10-11: qty 2

## 2024-10-11 NOTE — ED Notes (Signed)

## 2024-10-11 NOTE — ED Triage Notes (Signed)
 Pt present with Headache . Onset: 10/10/24 morning. Took prescribed Migraine medication x3  but was ineffective.  Last time taken 6 pm Endorsed N/V . Emesis X2 Light sensitivity

## 2024-10-11 NOTE — ED Provider Notes (Signed)
 [place] [facility]  Provider Note  CSN: [id number] Arrival date & time: 10/11/24 0244  History Chief Complaint  Patient presents with   Headache    Nancy Hull is a 48 y.o. female with history of chronic migraines reports persistent headache since yesterday morning, typical in nature with photophobia and nausea and not relieved with her home migraine medication x 3.    Home Medications Prior to Admission medications  Medication Sig Start Date End Date Taking? Authorizing Provider  acetaminophen  (TYLENOL ) 500 MG tablet Take 2 tablets (1,000 mg total) by mouth every 6 (six) hours as needed. 10/09/18   Maczis, Michael M, PA-C  benzonatate  (TESSALON ) 100 MG capsule Take 1 capsule (100 mg total) by mouth every 8 (eight) hours. 10/09/18   Maczis, Michael M, PA-C  clindamycin  (CLEOCIN ) 150 MG capsule Take 3 capsules (450 mg total) by mouth 3 (three) times daily. 10/17/19   Law, Alexandra M, PA-C  ibuprofen  (ADVIL ) 600 MG tablet Take 1 tablet (600 mg total) by mouth every 6 (six) hours as needed. 10/17/19   Law, Lorane HERO, PA-C  lidocaine  (XYLOCAINE ) 2 % solution Use as directed 15 mLs in the mouth or throat as needed for mouth pain. Patient not taking: Reported on 10/06/2018 07/05/16   Joy, Elouise BROCKS, PA-C  ondansetron  (ZOFRAN  ODT) 4 MG disintegrating tablet Take 1 tablet (4 mg total) by mouth every 8 (eight) hours as needed for nausea or vomiting. 08/16/21   Bradler, Evan K, MD  ondansetron  (ZOFRAN -ODT) 4 MG disintegrating tablet Take 1 tablet (4 mg total) by mouth every 8 (eight) hours as needed for nausea or vomiting. 06/19/22   Griselda Norris, MD  oxyCODONE  (ROXICODONE ) 5 MG immediate release tablet Take 1 tablet (5 mg total) by mouth every 6 (six) hours as needed for breakthrough pain. 08/27/24   Hinnant, Collin F, PA-C  Saccharomyces boulardii (PROBIOTIC) 250 MG CAPS Take 250 mg by mouth daily. 10/17/19   Law, Alexandra M, PA-C      Allergies    Nsaids   Review of Systems   Review of Systems Please see HPI for pertinent positives and negatives  Physical Exam BP 129/73   Pulse (!) 58   Temp 99.1 F (37.3 C) (Oral)   Resp 18   Ht 5' 2 (1.575 m)   Wt 68 kg   LMP 10/11/2024 (Approximate)   SpO2 99%   BMI 27.44 kg/m   Physical Exam Vitals and nursing note reviewed.  Constitutional:      Appearance: Normal appearance.  HENT:     Head: Normocephalic and atraumatic.     Nose: Nose normal.     Mouth/Throat:     Mouth: Mucous membranes are moist.  Eyes:     Extraocular Movements: Extraocular movements intact.     Conjunctiva/sclera: Conjunctivae normal.  Cardiovascular:     Rate and Rhythm: Normal rate.  Pulmonary:     Effort: Pulmonary effort is normal.     Breath sounds: Normal breath sounds.  Abdominal:     General: Abdomen is flat.     Palpations: Abdomen is soft.     Tenderness: There is no abdominal tenderness.  Musculoskeletal:        General: No swelling. Normal range of motion.     Cervical back: Neck supple.  Skin:    General: Skin is warm and dry.  Neurological:     General: No focal deficit present.     Mental Status: She  is alert.  Psychiatric:        Mood and Affect: Mood normal.     ED Results / Procedures / Treatments   EKG None  Procedures Procedures  Medications Ordered in the ED Medications  ketorolac  (TORADOL ) 30 MG/ML injection 30 mg (30 mg Intravenous Given 10/11/24 0343)  prochlorperazine  (COMPAZINE ) injection 10 mg (10 mg Intravenous Given 10/11/24 0342)  sodium chloride  0.9 % bolus 1,000 mL (1,000 mLs Intravenous New Bag/Given 10/11/24 0342)  diphenhydrAMINE  (BENADRYL ) injection 25 mg (25 mg Intravenous Given 10/11/24 0347)    Initial Impression and Plan  Patient here with typical migraine, exam and vitals are reassuring. Will give migraine cocktail (allergy to NSAIDs is intolerance due to PUD, has tolerated toradol  in the past).   ED Course    Clinical Course as of 10/11/24 0436  Sun Oct 11, 2024  [id number] Patient sleeping soundly on re-evaluation. Headache improved and she is comfortable going home. Recommend she continue home medications, PCP follow up, RTED for any other concerns.   [CS]    Clinical Course User Index [CS] Roselyn Carlin NOVAK, MD     MDM Rules/Calculators/A&P Medical Decision Making Problems Addressed: Nonintractable chronic migraine: acute illness or injury  Risk Prescription drug management.     Final Clinical Impression(s) / ED Diagnoses Final diagnoses:  Nonintractable chronic migraine    Rx / DC Orders ED Discharge Orders     None        Roselyn Carlin NOVAK, MD 10/11/24 [phone]
